# Patient Record
Sex: Female | Born: 1980 | Race: White | Hispanic: No | Smoking: Current every day smoker
Health system: Southern US, Community
[De-identification: ages and names within clinical notes are randomized; demographics above are authoritative.]

## PROBLEM LIST (undated history)

## (undated) DIAGNOSIS — K219 Gastro-esophageal reflux disease without esophagitis: Secondary | ICD-10-CM

## (undated) DIAGNOSIS — F419 Anxiety disorder, unspecified: Secondary | ICD-10-CM

## (undated) DIAGNOSIS — G43909 Migraine, unspecified, not intractable, without status migrainosus: Secondary | ICD-10-CM

## (undated) DIAGNOSIS — K589 Irritable bowel syndrome without diarrhea: Secondary | ICD-10-CM

## (undated) DIAGNOSIS — T7840XA Allergy, unspecified, initial encounter: Secondary | ICD-10-CM

## (undated) DIAGNOSIS — K9289 Other specified diseases of the digestive system: Secondary | ICD-10-CM

## (undated) DIAGNOSIS — F329 Major depressive disorder, single episode, unspecified: Secondary | ICD-10-CM

## (undated) DIAGNOSIS — F32A Depression, unspecified: Secondary | ICD-10-CM

## (undated) DIAGNOSIS — K649 Unspecified hemorrhoids: Secondary | ICD-10-CM

## (undated) DIAGNOSIS — K5900 Constipation, unspecified: Secondary | ICD-10-CM

## (undated) DIAGNOSIS — F909 Attention-deficit hyperactivity disorder, unspecified type: Secondary | ICD-10-CM

## (undated) HISTORY — DX: Constipation, unspecified: K59.00

## (undated) HISTORY — DX: Major depressive disorder, single episode, unspecified: F32.9

## (undated) HISTORY — DX: Anxiety disorder, unspecified: F41.9

## (undated) HISTORY — DX: Migraine, unspecified, not intractable, without status migrainosus: G43.909

## (undated) HISTORY — DX: Gastro-esophageal reflux disease without esophagitis: K21.9

## (undated) HISTORY — DX: Other specified diseases of the digestive system: K92.89

## (undated) HISTORY — PX: TUBAL LIGATION: SHX77

## (undated) HISTORY — DX: Attention-deficit hyperactivity disorder, unspecified type: F90.9

## (undated) HISTORY — DX: Depression, unspecified: F32.A

## (undated) HISTORY — DX: Unspecified hemorrhoids: K64.9

## (undated) HISTORY — DX: Allergy, unspecified, initial encounter: T78.40XA

## (undated) HISTORY — DX: Irritable bowel syndrome, unspecified: K58.9

---
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
## 2001-02-13 ENCOUNTER — Other Ambulatory Visit: Admission: RE | Admit: 2001-02-13 | Discharge: 2001-02-13 | Payer: Self-pay | Admitting: Family Medicine

## 2001-09-09 ENCOUNTER — Emergency Department (HOSPITAL_COMMUNITY): Admission: EM | Admit: 2001-09-09 | Discharge: 2001-09-10 | Payer: Self-pay | Admitting: Emergency Medicine

## 2002-03-12 ENCOUNTER — Other Ambulatory Visit: Admission: RE | Admit: 2002-03-12 | Discharge: 2002-03-12 | Payer: Self-pay | Admitting: Family Medicine

## 2003-04-12 ENCOUNTER — Other Ambulatory Visit: Admission: RE | Admit: 2003-04-12 | Discharge: 2003-04-12 | Payer: Self-pay | Admitting: Family Medicine

## 2004-02-15 ENCOUNTER — Ambulatory Visit: Payer: Self-pay | Admitting: Family Medicine

## 2004-06-29 ENCOUNTER — Ambulatory Visit: Payer: Self-pay | Admitting: Family Medicine

## 2005-04-16 ENCOUNTER — Ambulatory Visit: Payer: Self-pay | Admitting: Family Medicine

## 2005-05-30 ENCOUNTER — Other Ambulatory Visit: Admission: RE | Admit: 2005-05-30 | Discharge: 2005-05-30 | Payer: Self-pay | Admitting: Obstetrics and Gynecology

## 2005-07-11 ENCOUNTER — Ambulatory Visit: Payer: Self-pay | Admitting: Family Medicine

## 2005-12-07 ENCOUNTER — Inpatient Hospital Stay (HOSPITAL_COMMUNITY): Admission: AD | Admit: 2005-12-07 | Discharge: 2005-12-09 | Payer: Self-pay | Admitting: Obstetrics and Gynecology

## 2008-03-21 ENCOUNTER — Ambulatory Visit: Payer: Self-pay | Admitting: Family Medicine

## 2008-03-21 DIAGNOSIS — J069 Acute upper respiratory infection, unspecified: Secondary | ICD-10-CM | POA: Insufficient documentation

## 2009-02-18 HISTORY — PX: ESSURE TUBAL LIGATION: SUR464

## 2009-05-16 ENCOUNTER — Inpatient Hospital Stay (HOSPITAL_COMMUNITY): Admission: AD | Admit: 2009-05-16 | Discharge: 2009-05-16 | Payer: Self-pay | Admitting: Obstetrics & Gynecology

## 2009-08-30 ENCOUNTER — Inpatient Hospital Stay (HOSPITAL_COMMUNITY): Admission: RE | Admit: 2009-08-30 | Discharge: 2009-09-01 | Payer: Self-pay | Admitting: Obstetrics and Gynecology

## 2009-09-04 ENCOUNTER — Ambulatory Visit: Admission: RE | Admit: 2009-09-04 | Discharge: 2009-09-04 | Payer: Self-pay | Admitting: Obstetrics and Gynecology

## 2010-02-14 ENCOUNTER — Ambulatory Visit (HOSPITAL_COMMUNITY)
Admission: RE | Admit: 2010-02-14 | Discharge: 2010-02-14 | Payer: Self-pay | Source: Home / Self Care | Attending: Obstetrics and Gynecology | Admitting: Obstetrics and Gynecology

## 2010-04-11 ENCOUNTER — Ambulatory Visit (INDEPENDENT_AMBULATORY_CARE_PROVIDER_SITE_OTHER): Payer: BC Managed Care – PPO | Admitting: Family Medicine

## 2010-04-11 ENCOUNTER — Encounter: Payer: Self-pay | Admitting: Family Medicine

## 2010-04-11 DIAGNOSIS — J069 Acute upper respiratory infection, unspecified: Secondary | ICD-10-CM

## 2010-04-17 NOTE — Assessment & Plan Note (Signed)
Summary: sinus infection/alc   Vital Signs:  Patient profile:   30 year old female Weight:      142.75 pounds Temp:     98.4 degrees F oral Pulse rate:   67 / minute Pulse rhythm:   regular BP sitting:   110 / 72  (left arm) Cuff size:   regular  Vitals Entered By: Linde Gillis CMA Donika Butner Dull) (April 11, 2010 1:56 PM) CC: congestion    History of Present Illness: Chest congestion with cough for 10 days.  Head congestions started yesterday.  Mult sick contacts at home and work.  Nursing.  Taking prenatal vitamin.  NKDA.  Some chills last night.  No known fevers.  No diarrhea.  No vomiting.  Cough is improved.  The head congestion is the main issue for the patient.    Allergies (verified): No Known Drug Allergies  Review of Systems       See HPI.  Otherwise negative.    Physical Exam  General:  GEN: nad, alert and oriented HEENT: mucous membranes moist, TM w/o erythema, nasal epithelium injected, OP with cobblestoning but no exudates NECK: supple w/o LA CV: rrr. PULM: ctab, no inc wob ABD: soft, +bs EXT: no edema  max and frontal sinuses not tender to palpation    Impression & Recommendations:  Problem # 1:  URI (ICD-465.9) likely viral.  supporitve tx, nasal saline, rest and fluids.  follow up as needed.  I wouldn't start antibiotics now as this is likely going to resolve on its own.    Complete Medication List: 1)  Prenatal Vitamins 0.8 Mg Tabs (Prenatal multivit-min-fe-fa) .... Take one tablet by mouth daily  Patient Instructions: 1)  Get plenty of rest, drink lots of clear liquids, and use Tylenol for fever and comfort.  Call me next week if you're not better, sooner if you'er feeling worse.  This should gradually get better.  Call me if you have questions in the meantime.  Take care.    Orders Added: 1)  Est. Patient Level III [14782]    Current Allergies (reviewed today): No known allergies

## 2010-05-06 LAB — CBC
HCT: 32.8 % — ABNORMAL LOW (ref 36.0–46.0)
HCT: 35.9 % — ABNORMAL LOW (ref 36.0–46.0)
Hemoglobin: 12.3 g/dL (ref 12.0–15.0)
MCHC: 34.1 g/dL (ref 30.0–36.0)
MCHC: 34.2 g/dL (ref 30.0–36.0)
Platelets: 156 10*3/uL (ref 150–400)
RDW: 13.3 % (ref 11.5–15.5)
RDW: 13.5 % (ref 11.5–15.5)
WBC: 10.8 10*3/uL — ABNORMAL HIGH (ref 4.0–10.5)
WBC: 15 10*3/uL — ABNORMAL HIGH (ref 4.0–10.5)

## 2010-05-13 LAB — URINALYSIS, ROUTINE W REFLEX MICROSCOPIC
Hgb urine dipstick: NEGATIVE
Nitrite: NEGATIVE
Protein, ur: NEGATIVE mg/dL
pH: 6.5 (ref 5.0–8.0)

## 2010-10-29 ENCOUNTER — Encounter: Payer: Self-pay | Admitting: Family Medicine

## 2010-10-30 ENCOUNTER — Encounter: Payer: Self-pay | Admitting: Family Medicine

## 2010-10-30 ENCOUNTER — Ambulatory Visit (INDEPENDENT_AMBULATORY_CARE_PROVIDER_SITE_OTHER): Payer: Self-pay | Admitting: Family Medicine

## 2010-10-30 DIAGNOSIS — H9202 Otalgia, left ear: Secondary | ICD-10-CM

## 2010-10-30 DIAGNOSIS — H9209 Otalgia, unspecified ear: Secondary | ICD-10-CM

## 2010-10-30 NOTE — Assessment & Plan Note (Signed)
L serous otitis. Treat with nasal saline, nasal steroid (veramyst sample provided). Take ibuprofen for discomfort. If not improving or any worsening, low threshold to start abx, advised to call us if this is case. Pt agrees with plan.

## 2010-10-30 NOTE — Patient Instructions (Signed)
Looks like congestion behind L ear drum, no infection. Treat with nasal saline over the counter several times a day and veramyst sample 2 sprays per nostril daily. Take ibuprofen 400mg  2-3 times daily for discomfort. If fever >101, or worsening or not improving as expected, let us know.

## 2010-10-30 NOTE — Progress Notes (Signed)
  Subjective:    Patient ID: Connie Roach, female    DOB: December 18, 1980, 30 y.o.   MRN: [id number]  HPI CC: L ear pain  Pressure and pain left ear.  Started in right ear, then progressed to left ear yesterday.  Last night mild temperature.  No draining from ear.  No recent swimming.  No ringing in ears.  No hearing changes.  No RN, congestion, sneezing.  Mild ST.  No h/o ear problems in past.  Daughter on abx for sinus infection and ear infection.  Pt smokes some.    Review of Systems Per HPI    Objective:   Physical Exam  Nursing note and vitals reviewed. Constitutional: She appears well-developed and well-nourished. No distress.  HENT:  Head: Normocephalic and atraumatic.  Right Ear: Hearing, tympanic membrane, external ear and ear canal normal.  Left Ear: Hearing, external ear and ear canal normal.  Nose: Nose normal. No mucosal edema or rhinorrhea. Right sinus exhibits no maxillary sinus tenderness and no frontal sinus tenderness. Left sinus exhibits no maxillary sinus tenderness and no frontal sinus tenderness.  Mouth/Throat: Uvula is midline, oropharynx is clear and moist and mucous membranes are normal. No oropharyngeal exudate, posterior oropharyngeal edema, posterior oropharyngeal erythema or tonsillar abscesses.       L TM ear with fluid behind TM, no erythema, good light reflex.  Eyes: Conjunctivae and EOM are normal. Pupils are equal, round, and reactive to light. No scleral icterus.  Neck: Normal range of motion. Neck supple.  Lymphadenopathy:    She has no cervical adenopathy.  Skin: Skin is warm and dry. No rash noted.          Assessment & Plan:

## 2012-12-21 ENCOUNTER — Other Ambulatory Visit: Payer: Self-pay | Admitting: Obstetrics and Gynecology

## 2013-02-06 ENCOUNTER — Ambulatory Visit (INDEPENDENT_AMBULATORY_CARE_PROVIDER_SITE_OTHER): Payer: BC Managed Care – PPO | Admitting: Emergency Medicine

## 2013-02-06 VITALS — BP 98/60 | HR 83 | Temp 99.3°F | Resp 18 | Ht 62.5 in | Wt 108.0 lb

## 2013-02-06 DIAGNOSIS — J029 Acute pharyngitis, unspecified: Secondary | ICD-10-CM

## 2013-02-06 LAB — POCT RAPID STREP A (OFFICE): Rapid Strep A Screen: NEGATIVE

## 2013-02-06 NOTE — Progress Notes (Addendum)
Subjective:  This chart was scribed for Lesle Chris, MD by Quintella Reichert, ED scribe.  This patient was seen in room [facility] Room 4 and the patient's care was started at 3:43 PM.   Patient ID: Connie Roach, female    DOB: 1980-03-11, 32 y.o.   MRN: [id number]  Chief Complaint  Patient presents with  . Sore Throat    x1 mth   . Cough  . Otalgia    left ear pain x weeks     HPI  HPI Comments: Connie Roach is a 32 y.o. female who presents complaining of 2 weeks of worsening left ear pain.  Pt states that one month ago she initially developed a raspy cough with associated sore throat.  2 weeks ago she also developed intermittent aching pain in her left ear.  She states her coughing has improved and is now mostly limited to the morning and her sore throat is mostly limited to night.  However last night her ear pain worsened and it has been persistent since then.  She denies hearing loss.  She denies fevers.  She denies h/o seasonal allergies.  She notes that her mother has TMJ.  Pt's daughter has been sick recently with an ear infection and had issues with an allergic reaction to the amoxicillin she was placed on.   Patient Active Problem List   Diagnosis Date Noted  . Left ear pain 10/30/2010    Past Medical History  Diagnosis Date  . Depression     Past Surgical History  Procedure Laterality Date  . Essure tubal ligation  2011  . Tubal ligation      Prior to Admission medications   Medication Sig Start Date End Date Taking? Authorizing Provider  sertraline (ZOLOFT) 50 MG tablet Take 50 mg by mouth daily.   Yes Historical Provider, MD        Review of Systems  Constitutional: Negative for fever.  HENT: Positive for ear pain and sore throat. Negative for hearing loss.   Respiratory: Positive for cough.   Allergic/Immunologic: Negative for environmental allergies.       Objective:   Physical Exam CONSTITUTIONAL: Well developed/well nourished HEAD:  Normocephalic/atraumatic neither of the TMs move with pneumatic otoscopy. Her hearing is normal bilaterally weber does not localize Rinne is normal. EYES: EOMI/PERRL ENMT: Mucous membranes moist there is significant tenderness over the left TMJ NECK: supple no meningeal signs SPINE:entire spine nontender CV: S1/S2 noted, no murmurs/rubs/gallops noted LUNGS: Lungs are clear to auscultation bilaterally, no apparent distress ABDOMEN: soft, nontender, no rebound or guarding GU:no cva tenderness NEURO: Pt is awake/alert, moves all extremitiesx4 EXTREMITIES: pulses normal, full ROM SKIN: warm, color normal PSYCH: no abnormalities of mood noted   BP 98/60  Pulse 83  Temp(Src) 99.3 F (37.4 C) (Oral)  Resp 18  Ht 5' 2.5" (1.588 m)  Wt 108 lb (48.988 kg)  BMI 19.43 kg/m2  SpO2 99%  LMP 01/14/2013  Results for orders placed in visit on 02/06/13  POCT RAPID STREP A (OFFICE)      Result Value Range   Rapid Strep A Screen Negative  Negative        Assessment & Plan:  Patient advised to take Aleve 2 twice a day with food she was given information about TMJ and advised to follow up with her dentist    I personally performed the services described in this documentation, which was scribed in my presence. The recorded information has been reviewed and is accurate.

## 2013-02-06 NOTE — Patient Instructions (Signed)
Temporomandibular Problems  Temporomandibular joint (TMJ) dysfunction means there are problems with the joint between your jaw and your skull. This is a joint lined by cartilage like other joints in your body but also has a Hughlett disc in the joint which keeps the bones from rubbing on each other. These joints are like other joints and can get inflamed (sore) from arthritis and other problems. When this joint gets sore, it can cause headaches and pain in the jaw and the face. CAUSES  Usually the arthritic types of problems are caused by soreness in the joint. Soreness in the joint can also be caused by overuse. This may come from grinding your teeth. It may also come from mis-alignment in the joint. DIAGNOSIS Diagnosis of this condition can often be made by history and exam. Sometimes your caregiver may need X-rays or an MRI scan to determine the exact cause. It may be necessary to see your dentist to determine if your teeth and jaws are lined up correctly. TREATMENT  Most of the time this problem is not serious; however, sometimes it can persist (become chronic). When this happens medications that will cut down on inflammation (soreness) help. Sometimes a shot of cortisone into the joint will be helpful. If your teeth are not aligned it may help for your dentist to make a splint for your mouth that can help this problem. If no physical problems can be found, the problem may come from tension. If tension is found to be the cause, biofeedback or relaxation techniques may be helpful. HOME CARE INSTRUCTIONS   Later in the day, applications of ice packs may be helpful. Ice can be used in a plastic bag with a towel around it to prevent frostbite to skin. This may be used about every 2 hours for 20 to 30 minutes, as needed while awake, or as directed by your caregiver.  Only take over-the-counter or prescription medicines for pain, discomfort, or fever as directed by your caregiver.  If physical therapy was  prescribed, follow your caregiver's directions.  Wear mouth appliances as directed if they were given. Document Released: 10/30/2000 Document Revised: 04/29/2011 Document Reviewed: 02/07/2008 [employer] Patient Information 2014 [employer].  

## 2013-11-10 ENCOUNTER — Ambulatory Visit (INDEPENDENT_AMBULATORY_CARE_PROVIDER_SITE_OTHER): Payer: BC Managed Care – PPO | Admitting: Family Medicine

## 2013-11-10 ENCOUNTER — Telehealth: Payer: Self-pay | Admitting: Family Medicine

## 2013-11-10 ENCOUNTER — Encounter: Payer: Self-pay | Admitting: Family Medicine

## 2013-11-10 VITALS — BP 122/62 | HR 68 | Temp 98.2°F | Ht 62.5 in | Wt 113.5 lb

## 2013-11-10 DIAGNOSIS — J019 Acute sinusitis, unspecified: Secondary | ICD-10-CM

## 2013-11-10 DIAGNOSIS — B9689 Other specified bacterial agents as the cause of diseases classified elsewhere: Secondary | ICD-10-CM | POA: Insufficient documentation

## 2013-11-10 MED ORDER — AMOXICILLIN-POT CLAVULANATE 875-125 MG PO TABS: 1.0000 | ORAL_TABLET | Freq: Two times a day (BID) | ORAL | Status: DC

## 2013-11-10 NOTE — Progress Notes (Signed)
   Subjective:    Patient ID: Connie Roach, female    DOB: 02-09-1981, 33 y.o.   MRN: [id number]  HPI Here with uri symptoms and sinus pain   Started on Sept 8th  She has cough and bad head congestion  Cough is prod- typically yellow to clear   Sinuses - a lot of pressure - around both eyes  Ears are popping/ full and uncomfortable   Throat no longer hurts   No fever at home   She tried sudafed and mucinex otc for symptoms  No chance she is pregnant   Patient Active Problem List   Diagnosis Date Noted  . Left ear pain 10/30/2010   Past Medical History  Diagnosis Date  . Depression    Past Surgical History  Procedure Laterality Date  . Essure tubal ligation  2011   History  Substance Use Topics  . Smoking status: Current Some Day Smoker  . Smokeless tobacco: Not on file  . Alcohol Use: Yes     Comment: Occasional   No family history on file. No Known Allergies Current Outpatient Prescriptions on File Prior to Visit  Medication Sig Dispense Refill  . sertraline (ZOLOFT) 50 MG tablet Take 50 mg by mouth daily.       No current facility-administered medications on file prior to visit.      Review of Systems Review of Systems  Constitutional: Negative for fever, appetite change, and unexpected weight change. pos for fatigue and malaise  ENT pos for cong/rhinorrhea/ sinus pain  Eyes: Negative for pain and visual disturbance.  Respiratory: Negative for wheeze and shortness of breath.   Cardiovascular: Negative for cp or palpitations    Gastrointestinal: Negative for nausea, diarrhea and constipation.  Genitourinary: Negative for urgency and frequency.  Skin: Negative for pallor or rash   Neurological: Negative for weakness, light-headedness, numbness and headaches.  Hematological: Negative for adenopathy. Does not bruise/bleed easily.  Psychiatric/Behavioral: Negative for dysphoric mood. The patient is not nervous/anxious.         Objective:   Physical Exam    Constitutional: She appears well-developed and well-nourished. No distress.  HENT:  Head: Normocephalic and atraumatic.  Right Ear: External ear normal.  Left Ear: External ear normal.  Mouth/Throat: Oropharynx is clear and moist. No oropharyngeal exudate.  Nares are injected and congested   bilat maxillary and frontal sinus tenderness  Eyes: Conjunctivae and EOM are normal. Pupils are equal, round, and reactive to light.  Neck: Normal range of motion. Neck supple.  Cardiovascular: Normal rate, regular rhythm and normal heart sounds.   Pulmonary/Chest: Effort normal and breath sounds normal. No respiratory distress. She has no wheezes. She has no rales.  Lymphadenopathy:    She has no cervical adenopathy.  Neurological: She is alert. No cranial nerve deficit.  Skin: Skin is warm and dry. No rash noted.  Psychiatric: She has a normal mood and affect.          Assessment & Plan:   Problem List Items Addressed This Visit     Respiratory   Acute bacterial sinusitis - Primary     Cover with augmentin  Disc symptomatic care - see instructions on AVS  Update if not starting to improve in a week or if worsening      Relevant Medications      amoxicillin-clavulanate (AUGMENTIN) tablet 875-125 mg

## 2013-11-10 NOTE — Progress Notes (Signed)
Pre visit review using our clinic review tool, if applicable. No additional management support is needed unless otherwise documented below in the visit note. 

## 2013-11-10 NOTE — Telephone Encounter (Signed)
Patient Information:  Caller Name: Nyeli  Phone: [phone]  Patient: Connie Roach, Connie Roach  Gender: Female  DOB: [birth year]  Age: 33 Years  PCP: Roxy Manns [facility])  Pregnant: No  Office Follow Up:  Does the office need to follow up with this patient?: No  Instructions For The Office: N/A  RN Note:  Patient states she developed cough, nasal and chest congestion. Onset 10/26/13. Patient states she has tried Sudafed, Saline nasal spray, Mucinex, Zyrtec, Benadryl and Eucalyptis without improvement. Afebrile. States she expectorated thick, yellow sputum. Denies wheezing. Patient is taking fluids well. Denies sore throat at present. Patient complains of sinus pain/pressure. Care advice given per guidelines. Patient advised Neti Pot, Mucinex, inhaled steam, humidifier, warm fluids with honey. Call back parameters reviewed. Patient verbalizes understanding.  Symptoms  Reason For Call & Symptoms: Cough, Congestion  Reviewed Health History In EMR: Yes  Reviewed Medications In EMR: Yes  Reviewed Allergies In EMR: Yes  Reviewed Surgeries / Procedures: Yes  Date of Onset of Symptoms: 10/26/2013  Treatments Tried: Sudafed, Mucinex, Saline nasal Spray, Zyrtec, Benadryl, Eucalyptis  Treatments Tried Worked: No OB / GYN:  LMP: 11/08/2013  Guideline(s) Used:  Colds  Disposition Per Guideline:   See Today or Tomorrow in Office  Reason For Disposition Reached:   Sinus congestion (pressure, fullness) present > 10 days  Advice Given:  For a Stuffy Nose - Use Nasal Washes:  Introduction: Saline (salt water) nasal irrigation (nasal wash) is an effective and simple home remedy for treating stuffy nose and sinus congestion. The nose can be irrigated by pouring, spraying, or squirting salt water into the nose and then letting it run back out.  How it Helps: The salt water rinses out excess mucus, washes out any irritants (dust, allergens) that might be present, and moistens the nasal cavity.  Methods: There are several ways to perform nasal irrigation. You can use a saline nasal spray bottle (available over-the-counter), a rubber ear syringe, a medical syringe without the needle, or a Neti Pot.  Humidifier:  If the air in your home is dry, use a cool-mist humidifier  Call Back If:  Difficulty breathing occurs  Fever lasts more than 3 days  You become worse  Cough Medicines:  Home Remedy - Honey: This old home remedy has been shown to help decrease coughing at night. The adult dosage is 2 teaspoons (10 ml) at bedtime. Honey should not be given to infants under one year of age.  Patient Will Follow Care Advice:  YES  Appointment Scheduled:  11/10/2013 12:30:00 Appointment Scheduled Provider:  Roxy Manns [facility])

## 2013-11-10 NOTE — Telephone Encounter (Signed)
I will see her then  

## 2013-11-10 NOTE — Patient Instructions (Signed)
I think you have a bacterial sinus infection  Drink fluids/try to rest  Use warm compresses on your sinuses and breathe steam Try nasal saline spray also for congestion  mucinex and sudafed are ok if helpful  Take augmentin as directed  Update if not starting to improve in a week or if worsening

## 2013-11-11 NOTE — Assessment & Plan Note (Signed)
Cover with augmentin  Disc symptomatic care - see instructions on AVS  Update if not starting to improve in a week or if worsening   

## 2013-12-22 ENCOUNTER — Other Ambulatory Visit: Payer: Self-pay | Admitting: Obstetrics and Gynecology

## 2013-12-23 LAB — CYTOLOGY - PAP

## 2016-02-19 HISTORY — PX: OTHER SURGICAL HISTORY: SHX169

## 2017-08-12 ENCOUNTER — Other Ambulatory Visit: Payer: Self-pay | Admitting: Obstetrics and Gynecology

## 2017-08-12 DIAGNOSIS — R928 Other abnormal and inconclusive findings on diagnostic imaging of breast: Secondary | ICD-10-CM

## 2017-08-13 ENCOUNTER — Ambulatory Visit
Admission: RE | Admit: 2017-08-13 | Discharge: 2017-08-13 | Disposition: A | Discharge status: Home / Self Care | Payer: BLUE CROSS/BLUE SHIELD | Source: Ambulatory Visit | Attending: Obstetrics and Gynecology | Admitting: Obstetrics and Gynecology

## 2017-08-13 ENCOUNTER — Other Ambulatory Visit: Payer: Self-pay | Admitting: Obstetrics and Gynecology

## 2017-08-13 DIAGNOSIS — R928 Other abnormal and inconclusive findings on diagnostic imaging of breast: Secondary | ICD-10-CM

## 2017-08-19 ENCOUNTER — Other Ambulatory Visit: Payer: Self-pay | Admitting: Obstetrics and Gynecology

## 2017-08-19 DIAGNOSIS — N63 Unspecified lump in unspecified breast: Secondary | ICD-10-CM

## 2017-11-03 DIAGNOSIS — F32A Depression, unspecified: Secondary | ICD-10-CM | POA: Insufficient documentation

## 2017-12-24 ENCOUNTER — Ambulatory Visit (HOSPITAL_COMMUNITY)
Admission: EM | Admit: 2017-12-24 | Discharge: 2017-12-24 | Disposition: A | Discharge status: Home / Self Care | Payer: BLUE CROSS/BLUE SHIELD | Attending: Family Medicine | Admitting: Family Medicine

## 2017-12-24 ENCOUNTER — Encounter (HOSPITAL_COMMUNITY): Payer: Self-pay | Admitting: Emergency Medicine

## 2017-12-24 DIAGNOSIS — G43009 Migraine without aura, not intractable, without status migrainosus: Secondary | ICD-10-CM | POA: Diagnosis not present

## 2017-12-24 DIAGNOSIS — R42 Dizziness and giddiness: Secondary | ICD-10-CM

## 2017-12-24 MED ORDER — DEXAMETHASONE SODIUM PHOSPHATE 10 MG/ML IJ SOLN
INTRAMUSCULAR | Status: AC
  Filled 2017-12-24: qty 1

## 2017-12-24 MED ORDER — KETOROLAC TROMETHAMINE 60 MG/2ML IM SOLN
INTRAMUSCULAR | Status: AC
  Filled 2017-12-24: qty 2

## 2017-12-24 MED ORDER — DEXAMETHASONE SODIUM PHOSPHATE 10 MG/ML IJ SOLN
10.0000 mg | Freq: Once | INTRAMUSCULAR | Status: AC
  Administered 2017-12-24: 10 mg via INTRAMUSCULAR

## 2017-12-24 MED ORDER — KETOROLAC TROMETHAMINE 60 MG/2ML IM SOLN
60.0000 mg | Freq: Once | INTRAMUSCULAR | Status: AC
  Administered 2017-12-24: 60 mg via INTRAMUSCULAR

## 2017-12-24 MED ORDER — METOCLOPRAMIDE HCL 5 MG/ML IJ SOLN
INTRAMUSCULAR | Status: AC
  Filled 2017-12-24: qty 2

## 2017-12-24 MED ORDER — METOCLOPRAMIDE HCL 5 MG/ML IJ SOLN
5.0000 mg | Freq: Once | INTRAMUSCULAR | Status: AC
  Administered 2017-12-24: 5 mg via INTRAMUSCULAR

## 2017-12-24 NOTE — Discharge Instructions (Signed)
Toradol, decadron and reglan given in office.   Rest and drink plenty of fluids Recommend follow up with PCP for further evaluation and management of reoccurring migraines If symptoms do not improve with intervention or worsen go to the ED.  Go to the ED if you experience new symptoms such as fever, nausea, vomiting, abdominal pain, extremity weakness, slurred speech, facial droop, etc..Marland Kitchen

## 2017-12-24 NOTE — ED Provider Notes (Signed)
[facility]   [id number] 12/24/17 Arrival Time: 1125  [id number] and dizziness  SUBJECTIVE:  Connie Roach is a 37 y.o. female who complains of HA that started last night.  Denies a precipitating event, or recent head trauma.  Patient localizes her pain to the frontal aspect head.  Patient has tried oral sumatriptan without relief. Symptoms are made worse with light and noise.  Reports similar symptoms in the past.  Complains of associated photophobia, and phonophobia.    Patient also mentions intermittent dizziness that began after going on a vacation to [place].  Describes as she is moving while standing still.  Worse with sudden movements and position change.  Denies previous symptoms.  Reports "popping ears a lot."  Patient denies fever, chills, ear pain, nasal congestion, rhinorrhea, sore throat, nausea, vomiting, aura, rhinorrhea, watery eyes, chest pain, cough, SOB, abdominal pain, weakness, numbness or tingling.    ROS: As per HPI.  Past Medical History:  Diagnosis Date  . Depression    Past Surgical History:  Procedure Laterality Date  . ESSURE TUBAL LIGATION  2011   No Known Allergies No current facility-administered medications on file prior to encounter.    Current Outpatient Medications on File Prior to Encounter  Medication Sig Dispense Refill  . sertraline (ZOLOFT) 50 MG tablet Take 50 mg by mouth daily.     Social History   Socioeconomic History  . Marital status: Married    Spouse name: Not on file  . Number of children: Not on file  . Years of education: Not on file  . Highest education level: Not on file  Occupational History  . Not on file  Social Needs  . Financial resource strain: Not on file  . Food insecurity:    Worry: Not on file    Inability: Not on file  . Transportation needs:    Medical: Not on file    Non-medical: Not on file  Tobacco Use  . Smoking status: Current Some Day Smoker  Substance and Sexual Activity  . Alcohol  use: Yes    Comment: Occasional  . Drug use: No  . Sexual activity: Not on file  Lifestyle  . Physical activity:    Days per week: Not on file    Minutes per session: Not on file  . Stress: Not on file  Relationships  . Social connections:    Talks on phone: Not on file    Gets together: Not on file    Attends religious service: Not on file    Active member of club or organization: Not on file    Attends meetings of clubs or organizations: Not on file    Relationship status: Not on file  . Intimate partner violence:    Fear of current or ex partner: Not on file    Emotionally abused: Not on file    Physically abused: Not on file    Forced sexual activity: Not on file  Other Topics Concern  . Not on file  Social History Narrative  . Not on file   Family History  Problem Relation Age of Onset  . Breast cancer Paternal Grandmother 67  . Breast cancer Paternal Aunt 50    OBJECTIVE:  Vitals:   12/24/17 1155  BP: 113/73  Pulse: 69  Resp: 18  Temp: 98.5 F (36.9 C)  TempSrc: Oral  SpO2: 100%    General appearance: alert; no distress; laying on exam table in darkened room Eyes: PERRLA; EOMI HENT:  normocephalic; atraumatic Neck: supple with FROM Lungs: clear to auscultation bilaterally Heart: regular rate and rhythm.  Radial pulses 2+ symmetrical bilaterally Extremities: no edema; symmetrical with no gross deformities Skin: warm and dry Neurologic: CN 2-12 grossly intact; finger to nose without difficulty; normal gait; negative pronator drift; strength and sensation intact bilaterally about the upper and lower extremities Psychological: alert and cooperative; normal mood and affect  ASSESSMENT & PLAN:  1. Migraine without aura and without status migrainosus, not intractable   2. Vertigo     Meds ordered this encounter  Medications  . ketorolac (TORADOL) injection 60 mg  . metoCLOPramide (REGLAN) injection 5 mg  . dexamethasone (DECADRON) injection 10 mg    Toradol, decadron and reglan given in office.   Rest and drink plenty of fluids Recommend follow up with PCP for further evaluation and management of reoccurring migraines If symptoms do not improve with intervention or worsen go to the ED.  Go to the ED if you experience new symptoms such as fever, nausea, vomiting, abdominal pain, extremity weakness, slurred speech, facial droop, etc...  Reviewed expectations re: course of current medical issues. Questions answered. Outlined signs and symptoms indicating need for more acute intervention. Patient verbalized understanding. After Visit Summary given.   Rennis Harding, PA-C 12/24/17 1307

## 2017-12-24 NOTE — ED Triage Notes (Signed)
Pt sts frontal HA with some dizziness starting last night; pt sts hx of same

## 2018-02-13 ENCOUNTER — Other Ambulatory Visit: Payer: BLUE CROSS/BLUE SHIELD

## 2018-02-16 ENCOUNTER — Ambulatory Visit
Admission: RE | Admit: 2018-02-16 | Discharge: 2018-02-16 | Disposition: A | Discharge status: Home / Self Care | Payer: BLUE CROSS/BLUE SHIELD | Source: Ambulatory Visit | Attending: Obstetrics and Gynecology | Admitting: Obstetrics and Gynecology

## 2018-02-16 ENCOUNTER — Other Ambulatory Visit: Payer: Self-pay | Admitting: Obstetrics and Gynecology

## 2018-02-16 DIAGNOSIS — N6320 Unspecified lump in the left breast, unspecified quadrant: Secondary | ICD-10-CM

## 2018-02-16 DIAGNOSIS — N63 Unspecified lump in unspecified breast: Secondary | ICD-10-CM

## 2018-04-01 ENCOUNTER — Encounter: Payer: Self-pay | Admitting: Emergency Medicine

## 2018-04-01 ENCOUNTER — Ambulatory Visit
Admission: EM | Admit: 2018-04-01 | Discharge: 2018-04-01 | Disposition: A | Discharge status: Home / Self Care | Payer: BLUE CROSS/BLUE SHIELD | Attending: Family Medicine | Admitting: Family Medicine

## 2018-04-01 DIAGNOSIS — F17200 Nicotine dependence, unspecified, uncomplicated: Secondary | ICD-10-CM

## 2018-04-01 DIAGNOSIS — J0100 Acute maxillary sinusitis, unspecified: Secondary | ICD-10-CM

## 2018-04-01 MED ORDER — AMOXICILLIN-POT CLAVULANATE 875-125 MG PO TABS: 1.0000 | ORAL_TABLET | Freq: Two times a day (BID) | ORAL | 0 refills | Status: DC

## 2018-04-01 NOTE — ED Notes (Signed)
Patient able to ambulate independently  

## 2018-04-01 NOTE — ED Triage Notes (Signed)
Pt presents to [facility] for assessment of nasal congestion, headache, ear popping, post-nasal drip and cough x 1.5 weeks.

## 2018-04-01 NOTE — ED Provider Notes (Signed)
[facility]   [id number] 04/01/18 Arrival Time: 1221  ASSESSMENT & PLAN:  1. Acute non-recurrent maxillary sinusitis     Meds ordered this encounter  Medications  . amoxicillin-clavulanate (AUGMENTIN) 875-125 MG tablet    Sig: Take 1 tablet by mouth every 12 (twelve) hours.    Dispense:  20 tablet    Refill:  0   OTC symptom care as needed. Ensure adequate fluid intake and rest.  Follow-up Information    Marcelle Overlie, MD.   Specialty:  Obstetrics and Gynecology Why:  As needed. Contact information: [address] [place] [address] [phone]           Reviewed expectations re: course of current medical issues. Questions answered. Outlined signs and symptoms indicating need for more acute intervention. Patient verbalized understanding. After Visit Summary given.   SUBJECTIVE: History from: patient.  Connie Roach is a 38 y.o. female who presents with complaint of nasal congestion, post-nasal drainage, and sinus pain/pressure. Onset gradual, over the past few days. Cold/respiratory symptoms: over the past 1.5 weeks; mild dry cough. No SOB/wheezing. Fever: no. Overall normal PO intake without n/v. OTC treatment: Mucinex/Sudafed with minimal relief. History of frequent sinus infections: no. No specific aggravating or alleviating factors reported.  Social History   Tobacco Use  Smoking Status Current Some Day Smoker  Smokeless Tobacco Never Used   ROS: As per HPI. All other systems negative.  OBJECTIVE:  Vitals:   04/01/18 1230  BP: 125/90  Pulse: 84  Resp: 18  Temp: 97.8 F (36.6 C)  TempSrc: Oral  SpO2: 99%    General appearance: alert; appears fatigued HEENT: nasal congestion; clear runny nose; throat irritation secondary to post-nasal drainage; bilateral maxillary tenderness to palpation; turbinates boggy Neck: supple without LAD; trachea midline CV: RRR Lungs: unlabored respirations, symmetrical air entry;  cough: mild; no wheezing; no respiratory distress Skin: warm and dry Psychological: alert and cooperative; normal mood and affect  No Known Allergies  Past Medical History:  Diagnosis Date  . Depression    Family History  Problem Relation Age of Onset  . Breast cancer Paternal Grandmother 42  . Breast cancer Paternal Aunt 79   Social History   Socioeconomic History  . Marital status: Married    Spouse name: Not on file  . Number of children: Not on file  . Years of education: Not on file  . Highest education level: Not on file  Occupational History  . Not on file  Social Needs  . Financial resource strain: Not on file  . Food insecurity:    Worry: Not on file    Inability: Not on file  . Transportation needs:    Medical: Not on file    Non-medical: Not on file  Tobacco Use  . Smoking status: Current Some Day Smoker  . Smokeless tobacco: Never Used  Substance and Sexual Activity  . Alcohol use: Yes    Comment: Occasional  . Drug use: No  . Sexual activity: Not on file  Lifestyle  . Physical activity:    Days per week: Not on file    Minutes per session: Not on file  . Stress: Not on file  Relationships  . Social connections:    Talks on phone: Not on file    Gets together: Not on file    Attends religious service: Not on file    Active member of club or organization: Not on file    Attends meetings of  clubs or organizations: Not on file    Relationship status: Not on file  . Intimate partner violence:    Fear of current or ex partner: Not on file    Emotionally abused: Not on file    Physically abused: Not on file    Forced sexual activity: Not on file  Other Topics Concern  . Not on file  Social History Narrative  . Not on file            Mardella Layman, MD 04/01/18 1249

## 2018-08-17 ENCOUNTER — Other Ambulatory Visit: Payer: BLUE CROSS/BLUE SHIELD

## 2018-08-19 ENCOUNTER — Ambulatory Visit
Admission: RE | Admit: 2018-08-19 | Discharge: 2018-08-19 | Disposition: A | Discharge status: Home / Self Care | Payer: BC Managed Care – PPO | Source: Ambulatory Visit | Attending: Obstetrics and Gynecology | Admitting: Obstetrics and Gynecology

## 2018-08-19 ENCOUNTER — Ambulatory Visit
Admission: RE | Admit: 2018-08-19 | Discharge: 2018-08-19 | Disposition: A | Discharge status: Home / Self Care | Payer: BLUE CROSS/BLUE SHIELD | Source: Ambulatory Visit | Attending: Obstetrics and Gynecology | Admitting: Obstetrics and Gynecology

## 2018-08-19 ENCOUNTER — Other Ambulatory Visit: Payer: Self-pay

## 2018-08-19 DIAGNOSIS — N6320 Unspecified lump in the left breast, unspecified quadrant: Secondary | ICD-10-CM

## 2018-12-09 ENCOUNTER — Encounter: Payer: Self-pay | Admitting: *Deleted

## 2018-12-09 ENCOUNTER — Encounter: Payer: Self-pay | Admitting: Neurology

## 2018-12-09 ENCOUNTER — Ambulatory Visit (INDEPENDENT_AMBULATORY_CARE_PROVIDER_SITE_OTHER): Payer: Managed Care, Other (non HMO) | Admitting: Neurology

## 2018-12-09 ENCOUNTER — Other Ambulatory Visit: Payer: Self-pay

## 2018-12-09 VITALS — BP 102/64 | HR 80 | Temp 97.9°F | Ht 62.0 in | Wt 125.0 lb

## 2018-12-09 DIAGNOSIS — G43709 Chronic migraine without aura, not intractable, without status migrainosus: Secondary | ICD-10-CM | POA: Diagnosis not present

## 2018-12-09 DIAGNOSIS — H539 Unspecified visual disturbance: Secondary | ICD-10-CM

## 2018-12-09 DIAGNOSIS — G441 Vascular headache, not elsewhere classified: Secondary | ICD-10-CM | POA: Diagnosis not present

## 2018-12-09 DIAGNOSIS — G4484 Primary exertional headache: Secondary | ICD-10-CM

## 2018-12-09 DIAGNOSIS — G4440 Drug-induced headache, not elsewhere classified, not intractable: Secondary | ICD-10-CM | POA: Diagnosis not present

## 2018-12-09 DIAGNOSIS — R51 Headache with orthostatic component, not elsewhere classified: Secondary | ICD-10-CM

## 2018-12-09 MED ORDER — TOPIRAMATE 25 MG PO TABS: 25.0000 mg | ORAL_TABLET | Freq: Every day | ORAL | 3 refills | Status: DC

## 2018-12-09 MED ORDER — METOCLOPRAMIDE HCL 10 MG PO TABS: 10.0000 mg | ORAL_TABLET | Freq: Three times a day (TID) | ORAL | 11 refills | Status: DC | PRN

## 2018-12-09 NOTE — Patient Instructions (Signed)
MRI brain w/wo contrast Start Topamax for prevention Reduce use of ibuprofen max 2 days a week Reglan(Metaclopramide) acutely as needed  Metoclopramide tablets What is this medicine? METOCLOPRAMIDE (met oh kloe PRA mide) is used to treat the symptoms of gastroesophageal reflux disease (GERD) like heartburn. It is also used to treat people with slow emptying of the stomach and intestinal tract. This medicine may be used for other purposes; ask your health care provider or pharmacist if you have questions. COMMON BRAND NAME(S): Reglan What should I tell my health care provider before I take this medicine? They need to know if you have any of these conditions:  breast cancer  depression  diabetes  heart failure  high blood pressure  kidney disease  liver disease  Parkinson's disease or a movement disorder  pheochromocytoma  seizures  stomach obstruction, bleeding, or perforation  an unusual or allergic reaction to metoclopramide, procainamide, sulfites, other medicines, foods, dyes, or preservatives  pregnant or trying to get pregnant  breast-feeding How should I use this medicine? Take this medicine by mouth with a glass of water. Follow the directions on the prescription label. Take this medicine on an empty stomach, about 30 minutes before eating. Take your doses at regular intervals. Do not take your medicine more often than directed. Do not stop taking except on the advice of your doctor or health care professional. A special MedGuide will be given to you by the pharmacist with each prescription and refill. Be sure to read this information carefully each time. Talk to your pediatrician regarding the use of this medicine in children. Special care may be needed. Overdosage: If you think you have taken too much of this medicine contact a poison control center or emergency room at once. NOTE: This medicine is only for you. Do not share this medicine with others. What if I  miss a dose? If you miss a dose, take it as soon as you can. If it is almost time for your next dose, take only that dose. Do not take double or extra doses. What may interact with this medicine?  acetaminophen  cyclosporine  digoxin  medicines for blood pressure  medicines for diabetes, including insulin  medicines for hay fever and other allergies  medicines for depression, especially a Monoamine Oxidase Inhibitor (MAOI)  medicines for Parkinson's disease, like levodopa  medicines for sleep or for pain  quinidine  tetracycline This list may not describe all possible interactions. Give your health care provider a list of all the medicines, herbs, non-prescription drugs, or dietary supplements you use. Also tell them if you smoke, drink alcohol, or use illegal drugs. Some items may interact with your medicine. What should I watch for while using this medicine? It may take a few weeks for your stomach condition to start to get better. However, do not take this medicine for longer than 12 weeks. The longer you take this medicine, and the more you take it, the greater your chances are of developing serious side effects. If you are an elderly patient, a female patient, or you have diabetes, you may be at an increased risk for side effects from this medicine. Contact your doctor immediately if you start having movements you cannot control such as lip smacking, rapid movements of the tongue, involuntary or uncontrollable movements of the eyes, head, arms and legs, or muscle twitches and spasms. Patients and their families should watch out for worsening depression or thoughts of suicide. Also watch out for any sudden or  severe changes in feelings such as feeling anxious, agitated, panicky, irritable, hostile, aggressive, impulsive, severely restless, overly excited and hyperactive, or not being able to sleep. If this happens, especially at the beginning of treatment or after a change in dose,  call your doctor. Do not treat yourself for high fever. Ask your doctor or health care professional for advice. You may get drowsy or dizzy. Do not drive, use machinery, or do anything that needs mental alertness until you know how this drug affects you. Do not stand or sit up quickly, especially if you are an older patient. This reduces the risk of dizzy or fainting spells. Alcohol can make you more drowsy and dizzy. Avoid alcoholic drinks. What side effects may I notice from receiving this medicine? Side effects that you should report to your doctor or health care professional as soon as possible:  allergic reactions like skin rash, itching or hives, swelling of the face, lips, or tongue  abnormal production of milk in females  breast enlargement in both males and females  change in the way you walk  difficulty moving, speaking or swallowing  drooling, lip smacking, or rapid movements of the tongue  excessive sweating  fever  involuntary or uncontrollable movements of the eyes, head, arms and legs  irregular heartbeat or palpitations  muscle twitches and spasms  unusually weak or tired Side effects that usually do not require medical attention (report to your doctor or health care professional if they continue or are bothersome):  change in sex drive or performance  depressed mood  diarrhea  difficulty sleeping  headache  menstrual changes  restless or nervous This list may not describe all possible side effects. Call your doctor for medical advice about side effects. You may report side effects to FDA at 1-800-FDA-1088. Where should I keep my medicine? Keep out of the reach of children. Store at room temperature between 20 and 25 degrees C (68 and 77 degrees F). Protect from light. Keep container tightly closed. Throw away any unused medicine after the expiration date. NOTE: This sheet is a summary. It may not cover all possible information. If you have questions  about this medicine, talk to your doctor, pharmacist, or health care provider.  2020 Elsevier/Gold Standard (2015-11-22 15:13:45)  Topiramate tablets What is this medicine? TOPIRAMATE (toe PYRE a mate) is used to treat seizures in adults or children with epilepsy. It is also used for the prevention of migraine headaches. This medicine may be used for other purposes; ask your health care provider or pharmacist if you have questions. COMMON BRAND NAME(S): Topamax, Topiragen What should I tell my health care provider before I take this medicine? They need to know if you have any of these conditions:  bleeding disorders  cirrhosis of the liver or liver disease  diarrhea  glaucoma  kidney stones or kidney disease  low blood counts, like low white cell, platelet, or red cell counts  lung disease like asthma, obstructive pulmonary disease, emphysema  metabolic acidosis  on a ketogenic diet  schedule for surgery or a procedure  suicidal thoughts, plans, or attempt; a previous suicide attempt by you or a family member  an unusual or allergic reaction to topiramate, other medicines, foods, dyes, or preservatives  pregnant or trying to get pregnant  breast-feeding How should I use this medicine? Take this medicine by mouth with a glass of water. Follow the directions on the prescription label. Do not crush or chew. You may take this medicine with  meals. Take your medicine at regular intervals. Do not take it more often than directed. Talk to your pediatrician regarding the use of this medicine in children. Special care may be needed. While this drug may be prescribed for children as young as 12 years of age for selected conditions, precautions do apply. Overdosage: If you think you have taken too much of this medicine contact a poison control center or emergency room at once. NOTE: This medicine is only for you. Do not share this medicine with others. What if I miss a dose? If you  miss a dose, take it as soon as you can. If your next dose is to be taken in less than 6 hours, then do not take the missed dose. Take the next dose at your regular time. Do not take double or extra doses. What may interact with this medicine? Do not take this medicine with any of the following medications:  probenecid This medicine may also interact with the following medications:  acetazolamide  alcohol  amitriptyline  aspirin and aspirin-like medicines  birth control pills  certain medicines for depression  certain medicines for seizures  certain medicines that treat or prevent blood clots like warfarin, enoxaparin, dalteparin, apixaban, dabigatran, and rivaroxaban  digoxin  hydrochlorothiazide  lithium  medicines for pain, sleep, or muscle relaxation  metformin  methazolamide  NSAIDS, medicines for pain and inflammation, like ibuprofen or naproxen  pioglitazone  risperidone This list may not describe all possible interactions. Give your health care provider a list of all the medicines, herbs, non-prescription drugs, or dietary supplements you use. Also tell them if you smoke, drink alcohol, or use illegal drugs. Some items may interact with your medicine. What should I watch for while using this medicine? Visit your doctor or health care professional for regular checks on your progress. Do not stop taking this medicine suddenly. This increases the risk of seizures if you are using this medicine to control epilepsy. Wear a medical identification bracelet or chain to say you have epilepsy or seizures, and carry a card that lists all your medicines. This medicine can decrease sweating and increase your body temperature. Watch for signs of deceased sweating or fever, especially in children. Avoid extreme heat, hot baths, and saunas. Be careful about exercising, especially in hot weather. Contact your health care provider right away if you notice a fever or decrease in  sweating. You should drink plenty of fluids while taking this medicine. If you have had kidney stones in the past, this will help to reduce your chances of forming kidney stones. If you have stomach pain, with nausea or vomiting and yellowing of your eyes or skin, call your doctor immediately. You may get drowsy, dizzy, or have blurred vision. Do not drive, use machinery, or do anything that needs mental alertness until you know how this medicine affects you. To reduce dizziness, do not sit or stand up quickly, especially if you are an older patient. Alcohol can increase drowsiness and dizziness. Avoid alcoholic drinks. If you notice blurred vision, eye pain, or other eye problems, seek medical attention at once for an eye exam. The use of this medicine may increase the chance of suicidal thoughts or actions. Pay special attention to how you are responding while on this medicine. Any worsening of mood, or thoughts of suicide or dying should be reported to your health care professional right away. This medicine may increase the chance of developing metabolic acidosis. If left untreated, this can cause kidney  stones, bone disease, or slowed growth in children. Symptoms include breathing fast, fatigue, loss of appetite, irregular heartbeat, or loss of consciousness. Call your doctor immediately if you experience any of these side effects. Also, tell your doctor about any surgery you plan on having while taking this medicine since this may increase your risk for metabolic acidosis. Birth control pills may not work properly while you are taking this medicine. Talk to your doctor about using an extra method of birth control. Women who become pregnant while using this medicine may enroll in the [place] Pregnancy Registry by calling [phone]. This registry collects information about the safety of antiepileptic drug use during pregnancy. What side effects may I notice from receiving  this medicine? Side effects that you should report to your doctor or health care professional as soon as possible:  allergic reactions like skin rash, itching or hives, swelling of the face, lips, or tongue  decreased sweating and/or rise in body temperature  depression  difficulty breathing, fast or irregular breathing patterns  difficulty speaking  difficulty walking or controlling muscle movements  hearing impairment  redness, blistering, peeling or loosening of the skin, including inside the mouth  tingling, pain or numbness in the hands or feet  unusual bleeding or bruising  unusually weak or tired  worsening of mood, thoughts or actions of suicide or dying Side effects that usually do not require medical attention (report to your doctor or health care professional if they continue or are bothersome):  altered taste  back pain, joint or muscle aches and pains  diarrhea, or constipation  headache  loss of appetite  nausea  stomach upset, indigestion  tremors This list may not describe all possible side effects. Call your doctor for medical advice about side effects. You may report side effects to FDA at 1-800-FDA-1088. Where should I keep my medicine? Keep out of the reach of children. Store at room temperature between 15 and 30 degrees C (59 and 86 degrees F) in a tightly closed container. Protect from moisture. Throw away any unused medicine after the expiration date. NOTE: This sheet is a summary. It may not cover all possible information. If you have questions about this medicine, talk to your doctor, pharmacist, or health care provider.  2020 Elsevier/Gold Standard (2013-02-08 23:17:57)

## 2018-12-09 NOTE — Progress Notes (Signed)
[facility]    Provider:  Dr Lucia Gaskins Requesting Provider: Marcelle Overlie, MD Primary Care Provider:  Marcelle Overlie, MD  CC:  Migraine  HPI:  Connie Roach is a 38 y.o. female here as requested by Marcelle Overlie, MD for migraines and tension type headache.  Past medical history depression, migraines. Mother with migraines. Patient has had them for years maybe since college or sooner. They start behind the eyes, +blurry vision,  pulsating/pounding/throbbing, photo/phonophobia, taking ibuprofen daily, occ nausea, daily headaches, no vomiting. >8 migraine days a month sleep helps, last >24 hours and can be moderately severe or severe, sound makes it worse, stress makes it worse, no head trauma, headaches can be positional and she can wake up with headaches, movement or bending over makes it worse and worsens throughout the day especially with exertion.   Reviewed notes, labs and imaging from outside physicians, which showed:   I reviewed emergency room notes.  She complained of headache that started the night prior without any precipitating event or head trauma.  She tried oral sumatriptan without relief.  Photophobia phonophobia.  Similar symptoms in the past.  Frontal predominant.  Also dizziness, feels like she is moving while standing, worse with sitting movements and position change.  Also popping of ears.  Examination was normal including neurologic examination.  Diagnosed with migraine without aura and vertigo given Reglan, Decadron and Toradol.  I not see any labs since 2011 however, in 2011 she did have anemia and elevated white blood cells, RPR was negative.  I also reviewed referring physician's notes which included no information on migraines except that patient requested an appointment with neurology.   Review of Systems: Patient complains of symptoms per HPI as well as the following symptoms headache. Pertinent negatives and positives per HPI. All others  negative.   Social History   Socioeconomic History   Marital status: Married    Spouse name: separated, not legally   Number of children: 2   Years of education: Not on file   Highest education level: Bachelor's degree (e.g., BA, AB, BS)  Occupational History   Not on file  Social Needs   Financial resource strain: Not on file   Food insecurity    Worry: Not on file    Inability: Not on file   Transportation needs    Medical: Not on file    Non-medical: Not on file  Tobacco Use   Smoking status: Current Every Day Smoker   Smokeless tobacco: Never Used   Tobacco comment: 2 packs per week  Substance and Sexual Activity   Alcohol use: Yes    Alcohol/week: 4.0 standard drinks    Types: 4 Standard drinks or equivalent per week   Drug use: Yes    Frequency: 1.0 times per week    Types: Marijuana   Sexual activity: Not on file  Lifestyle   Physical activity    Days per week: Not on file    Minutes per session: Not on file   Stress: Not on file  Relationships   Social connections    Talks on phone: Not on file    Gets together: Not on file    Attends religious service: Not on file    Active member of club or organization: Not on file    Attends meetings of clubs or organizations: Not on file    Relationship status: Not on file   Intimate partner violence    Fear of current or ex partner: Not  on file    Emotionally abused: Not on file    Physically abused: Not on file    Forced sexual activity: Not on file  Other Topics Concern   Not on file  Social History Narrative   Lives at home with her daughters   Right handed   Caffeine: 1-2 cups/day    Family History  Problem Relation Age of Onset   Headache Mother    Emphysema Maternal Grandfather    Breast cancer Paternal Grandmother 40   Leukemia Paternal Grandfather    Breast cancer Paternal Aunt 74    Past Medical History:  Diagnosis Date   Anxiety    Depression    Migraine      Patient Active Problem List   Diagnosis Date Noted   Medication overuse headache 12/10/2018   Chronic migraine without aura without status migrainosus, not intractable 12/09/2018   Acute bacterial sinusitis 11/10/2013   Left ear pain 10/30/2010    Past Surgical History:  Procedure Laterality Date   ESSURE TUBAL LIGATION  2011   uterine ablation  2018    Current Outpatient Medications  Medication Sig Dispense Refill   ALPRAZolam (XANAX) 0.25 MG tablet TAKE 1 TABLET BY MOUTH 1 TIME A DAY AS NEEDED ANXIETY/PANIC ATTACK     Doxylamine Succinate, Sleep, (SLEEP AID PO) Take by mouth.     ibuprofen (ADVIL) 200 MG tablet Take 400 mg by mouth daily as needed.     sertraline (ZOLOFT) 100 MG tablet Take 150 mg by mouth daily.     metoCLOPramide (REGLAN) 10 MG tablet Take 1 tablet (10 mg total) by mouth 3 (three) times daily as needed. For migraine or headache. 30 tablet 11   topiramate (TOPAMAX) 25 MG tablet Take 1 tablet (25 mg total) by mouth at bedtime. 90 tablet 3   No current facility-administered medications for this visit.     Allergies as of 12/09/2018   (No Known Allergies)    Vitals: BP 102/64 (BP Location: Right Arm, Patient Position: Sitting)    Pulse 80    Temp 97.9 F (36.6 C) Comment: taken at front door   Ht 5\' 2"  (1.575 m)    Wt 125 lb (56.7 kg)    BMI 22.86 kg/m  Last Weight:  Wt Readings from Last 1 Encounters:  12/09/18 125 lb (56.7 kg)   Last Height:   Ht Readings from Last 1 Encounters:  12/09/18 5\' 2"  (1.575 m)     Physical exam: Exam: Gen: NAD, conversant, well nourised, well groomed                     CV: RRR, no MRG. No Carotid Bruits. No peripheral edema, warm, nontender Eyes: Conjunctivae clear without exudates or hemorrhage  Neuro: Detailed Neurologic Exam  Speech:    Speech is normal; fluent and spontaneous with normal comprehension.  Cognition:    The patient is oriented to person, place, and time;     recent and remote  memory intact;     language fluent;     normal attention, concentration,     fund of knowledge Cranial Nerves:    The pupils are equal, round, and reactive to light. The fundi are normal and spontaneous venous pulsations are present. Visual fields are full to finger confrontation. Extraocular movements are intact. Trigeminal sensation is intact and the muscles of mastication are normal. The face is symmetric. The palate elevates in the midline. Hearing intact. Voice is normal. Shoulder shrug is  normal. The tongue has normal motion without fasciculations.   Coordination:    Normal finger to nose and heel to shin. Normal rapid alternating movements.   Gait:    Heel-toe and tandem gait are normal.   Motor Observation:    No asymmetry, no atrophy, and no involuntary movements noted. Tone:    Normal muscle tone.    Posture:    Posture is normal. normal erect    Strength:    Strength is V/V in the upper and lower limbs.      Sensation: intact to LT     Reflex Exam:  DTR's:    Deep tendon reflexes in the upper and lower extremities are normal bilaterally.   Toes:    The toes are downgoing bilaterally.   Clonus:    Clonus is absent.    Assessment/Plan:  Chronic daily headaches likely due to Migraines and medication overuse  But given concerning symptoms she needs a thorough evaluation including MRI brain. I had a long discussion with patient that her daily use of ibuprofen or Tylenol can cause medication overuse/rebound headache which is contributing to her chronic daily headaches. They only thing to do is to stop the medication unfortunately. In the timeframe after stopping at her headaches may get much worse. I will give her some medication to try to bridge that. She should significantly  improve with her chronic daily headaches after 2-4 weeks of being off her daily over-the-counter medications. Do not use these medications more than 2 times in a week.    MRI brain due to  concerning symptoms of morning headaches, positional headaches,exertional and orbital headaches  to look for space occupying mass, chiari or intracranial hypertension (pseudotumor).  [facility] TSH and was normal per patient report, will request  Migraines: Discussed migraine management including acute management and preventative management. We'll start patient on both. Discussed Topamax side effects especially teratogenicity do not get pregnant and use birth control.  Meds ordered this encounter  Medications   topiramate (TOPAMAX) 25 MG tablet    Sig: Take 1 tablet (25 mg total) by mouth at bedtime.    Dispense:  90 tablet    Refill:  3   metoCLOPramide (REGLAN) 10 MG tablet    Sig: Take 1 tablet (10 mg total) by mouth 3 (three) times daily as needed. For migraine or headache.    Dispense:  30 tablet    Refill:  11     Remember to drink plenty of fluid, eat healthy meals and do not skip any meals. Try to eat protein with a every meal and eat a healthy snack such as fruit or nuts in between meals. Try to keep a regular sleep-wake schedule and try to exercise daily, particularly in the form of walking, 20-30 minutes a day, if you can.   As far as your medications are concerned, I would like to suggest: - Stop daily over the counter med use (Tylenol, excedrin, ibuprofen, alleve) Do not tak emore than 2-3x in one week -At onset of Migraine reglan.  - Can increase Topiramate, discussed cgrp and botox. She prefers a low dose, advised we can further increase.   As far as diagnostic testing: If headaches persist need MRI brain  Discussed; To prevent or relieve headaches, try the following:  Cool Compress. Lie down and place a cool compress on your head.   Avoid headache triggers. If certain foods or odors seem to have triggered your migraines  in the past, avoid them. A headache diary might help you identify triggers.   Include physical activity  in your daily routine. Try a daily walk or other moderate aerobic exercise.   Manage stress. Find healthy ways to cope with the stressors, such as delegating tasks on your to-do list.   Practice relaxation techniques. Try deep breathing, yoga, massage and visualization.   Eat regularly. Eating regularly scheduled meals and maintaining a healthy diet might help prevent headaches. Also, drink plenty of fluids.   Follow a regular sleep schedule. Sleep deprivation might contribute to headaches  Consider biofeedback. With this mind-body technique, you learn to control certain bodily functions -- such as muscle tension, heart rate and blood pressure -- to prevent headaches or reduce headache pain.    Proceed to emergency room if you experience new or worsening symptoms or symptoms do not resolve, if you have new neurologic symptoms or if headache is severe, or for any concerning symptom.    Cc: Dian Queen, MD,    Sarina Ill, MD  [facility] [address] [place], [place] [address]  Phone [phone] Fax [phone]

## 2018-12-10 ENCOUNTER — Encounter: Payer: Self-pay | Admitting: Neurology

## 2018-12-10 DIAGNOSIS — G4440 Drug-induced headache, not elsewhere classified, not intractable: Secondary | ICD-10-CM | POA: Insufficient documentation

## 2018-12-14 ENCOUNTER — Telehealth: Payer: Self-pay | Admitting: Neurology

## 2018-12-14 NOTE — Telephone Encounter (Signed)
[employer] order sent to GI. They will obtain the auth and reach out to the patient to schedule.  °

## 2018-12-20 ENCOUNTER — Other Ambulatory Visit: Payer: Self-pay | Admitting: Neurology

## 2018-12-20 MED ORDER — TOPIRAMATE 25 MG PO TABS: 25.0000 mg | ORAL_TABLET | Freq: Every day | ORAL | 3 refills | Status: DC

## 2019-03-15 ENCOUNTER — Ambulatory Visit (INDEPENDENT_AMBULATORY_CARE_PROVIDER_SITE_OTHER): Payer: Managed Care, Other (non HMO) | Admitting: Neurology

## 2019-03-15 ENCOUNTER — Encounter: Payer: Self-pay | Admitting: Neurology

## 2019-03-15 ENCOUNTER — Other Ambulatory Visit: Payer: Self-pay

## 2019-03-15 VITALS — BP 102/68 | HR 80 | Temp 97.7°F | Ht 62.0 in | Wt 124.0 lb

## 2019-03-15 DIAGNOSIS — G43709 Chronic migraine without aura, not intractable, without status migrainosus: Secondary | ICD-10-CM | POA: Diagnosis not present

## 2019-03-15 MED ORDER — TOPIRAMATE 25 MG PO TABS: 25.0000 mg | ORAL_TABLET | Freq: Every day | ORAL | 3 refills | Status: DC

## 2019-03-15 MED ORDER — METOCLOPRAMIDE HCL 10 MG PO TABS: 10.0000 mg | ORAL_TABLET | Freq: Three times a day (TID) | ORAL | 11 refills | Status: DC | PRN

## 2019-03-15 NOTE — Progress Notes (Signed)
[facility]    Provider:  Dr Lucia Gaskins Requesting Provider: Marcelle Overlie, MD Primary Care Provider:  Marcelle Overlie, MD  CC:  Migraine  Interval history March 15, 2019: Patient is here today for follow-up of her migraines, I last appointment she was given Reglan acutely and started on low-dose topiramate.  MRI of the brain was ordered at last appointment however it has not been completed.  The Reglan helps acutely. She is improving. She is not taking ibuprofen as much.  She used 30 tablets in the last 3 month only 10 a month, she just refilled it, will refill for a year.   HPI:  Connie Roach is a 39 y.o. female here as requested by Marcelle Overlie, MD for migraines and tension type headache.  Past medical history depression, migraines. Mother with migraines. Patient has had them for years maybe since college or sooner. They start behind the eyes, +blurry vision,  pulsating/pounding/throbbing, photo/phonophobia, taking ibuprofen daily, occ nausea, daily headaches, no vomiting. >8 migraine days a month sleep helps, last >24 hours and can be moderately severe or severe, sound makes it worse, stress makes it worse, no head trauma, headaches can be positional and she can wake up with headaches, movement or bending over makes it worse and worsens throughout the day especially with exertion.   Reviewed notes, labs and imaging from outside physicians, which showed:   I reviewed emergency room notes.  She complained of headache that started the night prior without any precipitating event or head trauma.  She tried oral sumatriptan without relief.  Photophobia phonophobia.  Similar symptoms in the past.  Frontal predominant.  Also dizziness, feels like she is moving while standing, worse with sitting movements and position change.  Also popping of ears.  Examination was normal including neurologic examination.  Diagnosed with migraine without aura and vertigo given Reglan, Decadron  and Toradol.  I not see any labs since 2011 however, in 2011 she did have anemia and elevated white blood cells, RPR was negative.  I also reviewed referring physician's notes which included no information on migraines except that patient requested an appointment with neurology.   Review of Systems: Patient complains of symptoms per HPI as well as the following symptoms headache. Pertinent negatives and positives per HPI. All others negative.   Social History   Socioeconomic History  . Marital status: Married    Spouse name: separated, not legally  . Number of children: 2  . Years of education: Not on file  . Highest education level: Bachelor's degree (e.g., BA, AB, BS)  Occupational History  . Not on file  Tobacco Use  . Smoking status: Current Every Day Smoker  . Smokeless tobacco: Never Used  . Tobacco comment: 2 packs per week  Substance and Sexual Activity  . Alcohol use: Yes    Alcohol/week: 4.0 standard drinks    Types: 4 Standard drinks or equivalent per week  . Drug use: Yes    Frequency: 1.0 times per week    Types: Marijuana  . Sexual activity: Not on file  Other Topics Concern  . Not on file  Social History Narrative   Lives at home with her daughters   Right handed   Caffeine: 1-2 cups/day   Social Determinants of Health   Financial Resource Strain:   . Difficulty of Paying Living Expenses: Not on file  Food Insecurity:   . Worried About Programme researcher, broadcasting/film/video in the Last Year: Not on file  .  Ran Out of Food in the Last Year: Not on file  Transportation Needs:   . Lack of Transportation (Medical): Not on file  . Lack of Transportation (Non-Medical): Not on file  Physical Activity:   . Days of Exercise per Week: Not on file  . Minutes of Exercise per Session: Not on file  Stress:   . Feeling of Stress : Not on file  Social Connections:   . Frequency of Communication with Friends and Family: Not on file  . Frequency of Social Gatherings with Friends and  Family: Not on file  . Attends Religious Services: Not on file  . Active Member of Clubs or Organizations: Not on file  . Attends Banker Meetings: Not on file  . Marital Status: Not on file  Intimate Partner Violence:   . Fear of Current or Ex-Partner: Not on file  . Emotionally Abused: Not on file  . Physically Abused: Not on file  . Sexually Abused: Not on file    Family History  Problem Relation Age of Onset  . Headache Mother   . Emphysema Maternal Grandfather   . Breast cancer Paternal Grandmother 72  . Leukemia Paternal Grandfather   . Breast cancer Paternal Aunt 49    Past Medical History:  Diagnosis Date  . Anxiety   . Depression   . Migraine     Patient Active Problem List   Diagnosis Date Noted  . Medication overuse headache 12/10/2018  . Chronic migraine without aura without status migrainosus, not intractable 12/09/2018  . Acute bacterial sinusitis 11/10/2013  . Left ear pain 10/30/2010    Past Surgical History:  Procedure Laterality Date  . ESSURE TUBAL LIGATION  2011  . uterine ablation  2018    Current Outpatient Medications  Medication Sig Dispense Refill  . ALPRAZolam (XANAX) 0.25 MG tablet TAKE 1 TABLET BY MOUTH 1 TIME A DAY AS NEEDED ANXIETY/PANIC ATTACK    . Doxylamine Succinate, Sleep, (SLEEP AID PO) Take by mouth.    Marland Kitchen ibuprofen (ADVIL) 200 MG tablet Take 400 mg by mouth daily as needed.    . metoCLOPramide (REGLAN) 10 MG tablet Take 1 tablet (10 mg total) by mouth 3 (three) times daily as needed. For migraine or headache. 30 tablet 11  . sertraline (ZOLOFT) 100 MG tablet Take 150 mg by mouth daily.    Marland Kitchen topiramate (TOPAMAX) 25 MG tablet Take 1 tablet (25 mg total) by mouth at bedtime. 90 tablet 3   No current facility-administered medications for this visit.    Allergies as of 03/15/2019  . (No Known Allergies)    Vitals: BP 102/68 (BP Location: Right Arm, Patient Position: Sitting)   Pulse 80   Temp 97.7 F (36.5 C)  Comment: taken at front  Ht 5\' 2"  (1.575 m)   Wt 124 lb (56.2 kg)   BMI 22.68 kg/m  Last Weight:  Wt Readings from Last 1 Encounters:  03/15/19 124 lb (56.2 kg)   Last Height:   Ht Readings from Last 1 Encounters:  03/15/19 5\' 2"  (1.575 m)     Physical exam: Exam: Gen: NAD, conversant, well nourised, well groomed                     CV: RRR, no MRG. No Carotid Bruits. No peripheral edema, warm, nontender Eyes: Conjunctivae clear without exudates or hemorrhage  Neuro: Detailed Neurologic Exam  Speech:    Speech is normal; fluent and spontaneous with normal comprehension.  Cognition:    The patient is oriented to person, place, and time;     recent and remote memory intact;     language fluent;     normal attention, concentration,     fund of knowledge Cranial Nerves:    The pupils are equal, round, and reactive to light. The fundi are normal and spontaneous venous pulsations are present. Visual fields are full to finger confrontation. Extraocular movements are intact. Trigeminal sensation is intact and the muscles of mastication are normal. The face is symmetric. The palate elevates in the midline. Hearing intact. Voice is normal. Shoulder shrug is normal. The tongue has normal motion without fasciculations.   Coordination:    Normal finger to nose and heel to shin. Normal rapid alternating movements.   Gait:    Heel-toe and tandem gait are normal.   Motor Observation:    No asymmetry, no atrophy, and no involuntary movements noted. Tone:    Normal muscle tone.    Posture:    Posture is normal. normal erect    Strength:    Strength is V/V in the upper and lower limbs.      Sensation: intact to LT     Reflex Exam:  DTR's:    Deep tendon reflexes in the upper and lower extremities are normal bilaterally.   Toes:    The toes are downgoing bilaterally.   Clonus:    Clonus is absent.    Assessment/Plan:  Chronic daily headaches likely due to Migraines and  medication overuse  But given concerning symptoms she needs a thorough evaluation including MRI brain. I had a long discussion with patient that her daily use of ibuprofen or Tylenol can cause medication overuse/rebound headache which is contributing to her chronic daily headaches. They only thing to do is to stop the medication unfortunately. In the timeframe after stopping at her headaches may get much worse. I will give her some medication to try to bridge that. She should significantly  improve with her chronic daily headaches after 2-4 weeks of being off her daily over-the-counter medications. Do not use these medications more than 2 times in a week.  - She is doing exceptional on Topamax, stopped overusing OTC meds, Reglan working acutely. She can follow up as needed and if continues to do well she can ask her pcp or obgyn to refill topamax so she doesn't have to pay a specialist appointment but I am always available via email or if she would like to follow up with me anytime.   Migraines: Discussed migraine management including acute management and preventative management. We'll start patient on both. Discussed Topamax side effects especially teratogenicity do not get pregnant and use birth control.  Meds ordered this encounter  Medications  . topiramate (TOPAMAX) 25 MG tablet    Sig: Take 1 tablet (25 mg total) by mouth at bedtime.    Dispense:  90 tablet    Refill:  3  . metoCLOPramide (REGLAN) 10 MG tablet    Sig: Take 1 tablet (10 mg total) by mouth 3 (three) times daily as needed. For migraine or headache.    Dispense:  30 tablet    Refill:  11     Remember to drink plenty of fluid, eat healthy meals and do not skip any meals. Try to eat protein with a every meal and eat a healthy snack such as fruit or nuts in between meals. Try to keep a regular sleep-wake schedule and try to exercise daily,  particularly in the form of walking, 20-30 minutes a day, if you can.   As far as your  medications are concerned, I would like to suggest: - Stop daily over the counter med use (Tylenol, excedrin, ibuprofen, alleve) Do not tak emore than 2-3x in one week -At onset of Migraine reglan.  - Can increase Topiramate, discussed cgrp and botox. She prefers a low dose, advised we can further increase.   As far as diagnostic testing: If headaches persist need MRI brain  Discussed; To prevent or relieve headaches, try the following:  Cool Compress. Lie down and place a cool compress on your head.   Avoid headache triggers. If certain foods or odors seem to have triggered your migraines in the past, avoid them. A headache diary might help you identify triggers.   Include physical activity in your daily routine. Try a daily walk or other moderate aerobic exercise.   Manage stress. Find healthy ways to cope with the stressors, such as delegating tasks on your to-do list.   Practice relaxation techniques. Try deep breathing, yoga, massage and visualization.   Eat regularly. Eating regularly scheduled meals and maintaining a healthy diet might help prevent headaches. Also, drink plenty of fluids.   Follow a regular sleep schedule. Sleep deprivation might contribute to headaches  Consider biofeedback. With this mind-body technique, you learn to control certain bodily functions -- such as muscle tension, heart rate and blood pressure -- to prevent headaches or reduce headache pain.    Proceed to emergency room if you experience new or worsening symptoms or symptoms do not resolve, if you have new neurologic symptoms or if headache is severe, or for any concerning symptom.    Cc: Dian Queen, MD,    A total of 15 minutes was spent on this patient's care, reviewing imaging, past records, recent hospitalization notes and results. Over half this time was spent on counseling patient on the  1. Chronic migraine without aura without status migrainosus, not intractable    diagnosis  and different diagnostic and therapeutic options, counseling and coordination of care, risks and benefitsof management, compliance, or risk factor reduction and education.     Sarina Ill, MD  [facility] [address] [place], [place] [address]  Phone [phone] Fax [phone]

## 2019-05-24 ENCOUNTER — Other Ambulatory Visit: Payer: Self-pay | Admitting: *Deleted

## 2019-05-24 MED ORDER — TOPIRAMATE 25 MG PO TABS: 25.0000 mg | ORAL_TABLET | Freq: Every day | ORAL | 2 refills | Status: DC

## 2019-05-24 NOTE — Telephone Encounter (Signed)
Request from [facility] for topiramate 25 mg tablet refill #90.

## 2019-11-02 DIAGNOSIS — R87612 Low grade squamous intraepithelial lesion on cytologic smear of cervix (LGSIL): Secondary | ICD-10-CM | POA: Insufficient documentation

## 2020-01-08 ENCOUNTER — Emergency Department (HOSPITAL_COMMUNITY)
Admission: EM | Admit: 2020-01-08 | Discharge: 2020-01-08 | Disposition: A | Discharge status: Home / Self Care | Payer: Managed Care, Other (non HMO) | Attending: Emergency Medicine | Admitting: Emergency Medicine

## 2020-01-08 ENCOUNTER — Encounter (HOSPITAL_COMMUNITY): Payer: Self-pay | Admitting: Emergency Medicine

## 2020-01-08 ENCOUNTER — Other Ambulatory Visit: Payer: Self-pay

## 2020-01-08 DIAGNOSIS — N939 Abnormal uterine and vaginal bleeding, unspecified: Secondary | ICD-10-CM | POA: Diagnosis present

## 2020-01-08 DIAGNOSIS — F17200 Nicotine dependence, unspecified, uncomplicated: Secondary | ICD-10-CM | POA: Insufficient documentation

## 2020-01-08 LAB — CBC
HCT: 43.9 % (ref 36.0–46.0)
Hemoglobin: 14 g/dL (ref 12.0–15.0)
MCH: 30.9 pg (ref 26.0–34.0)
MCHC: 31.9 g/dL (ref 30.0–36.0)
MCV: 96.9 fL (ref 80.0–100.0)
Platelets: 253 10*3/uL (ref 150–400)
RBC: 4.53 MIL/uL (ref 3.87–5.11)
RDW: 12.9 % (ref 11.5–15.5)
WBC: 14.8 10*3/uL — ABNORMAL HIGH (ref 4.0–10.5)
nRBC: 0 % (ref 0.0–0.2)

## 2020-01-08 LAB — I-STAT CHEM 8, ED
BUN: 11 mg/dL (ref 6–20)
Calcium, Ion: 1.25 mmol/L (ref 1.15–1.40)
Chloride: 102 mmol/L (ref 98–111)
Creatinine, Ser: 0.7 mg/dL (ref 0.44–1.00)
Glucose, Bld: 105 mg/dL — ABNORMAL HIGH (ref 70–99)
HCT: 46 % (ref 36.0–46.0)
Hemoglobin: 15.6 g/dL — ABNORMAL HIGH (ref 12.0–15.0)
Potassium: 3.1 mmol/L — ABNORMAL LOW (ref 3.5–5.1)
Sodium: 139 mmol/L (ref 135–145)
TCO2: 22 mmol/L (ref 22–32)

## 2020-01-08 LAB — BASIC METABOLIC PANEL
Anion gap: 9 (ref 5–15)
BUN: 9 mg/dL (ref 6–20)
CO2: 24 mmol/L (ref 22–32)
Calcium: 9.4 mg/dL (ref 8.9–10.3)
Chloride: 103 mmol/L (ref 98–111)
Creatinine, Ser: 0.77 mg/dL (ref 0.44–1.00)
GFR, Estimated: >60 mL/min (ref >60)
Glucose, Bld: 113 mg/dL — ABNORMAL HIGH (ref 70–99)
Potassium: 3 mmol/L — ABNORMAL LOW (ref 3.5–5.1)
Sodium: 136 mmol/L (ref 135–145)

## 2020-01-08 LAB — HEMOGLOBIN AND HEMATOCRIT, BLOOD
HCT: 42.7 % (ref 36.0–46.0)
Hemoglobin: 13.5 g/dL (ref 12.0–15.0)

## 2020-01-08 LAB — TYPE AND SCREEN
ABO/RH(D): O POS
Antibody Screen: NEGATIVE

## 2020-01-08 LAB — PROTIME-INR
INR: 1 (ref 0.8–1.2)
Prothrombin Time: 13 seconds (ref 11.4–15.2)

## 2020-01-08 LAB — I-STAT BETA HCG BLOOD, ED (MC, WL, AP ONLY): I-stat hCG, quantitative: <5 m[IU]/mL (ref <5)

## 2020-01-08 LAB — APTT: aPTT: 27 s (ref 24–36)

## 2020-01-08 MED ORDER — LACTATED RINGERS IV BOLUS
1000.0000 mL | Freq: Once | INTRAVENOUS | Status: AC
  Administered 2020-01-08: 1000 mL via INTRAVENOUS

## 2020-01-08 MED ORDER — MONSELS FERRIC SUBSULFATE EX SOLN
Freq: Once | CUTANEOUS | Status: DC
  Filled 2020-01-08 (×2): qty 8

## 2020-01-08 MED ORDER — BUTORPHANOL TARTRATE 1 MG/ML IJ SOLN: 1.0000 mg | Freq: Once | INTRAMUSCULAR | Status: DC

## 2020-01-08 MED ORDER — SODIUM CHLORIDE 0.9 % IV BOLUS
1000.0000 mL | Freq: Once | INTRAVENOUS | Status: AC
  Administered 2020-01-08: 1000 mL via INTRAVENOUS

## 2020-01-08 MED ORDER — TRANEXAMIC ACID-NACL 1000-0.7 MG/100ML-% IV SOLN
1000.0000 mg | Freq: Once | INTRAVENOUS | Status: AC
  Administered 2020-01-08: 1000 mg via INTRAVENOUS
  Filled 2020-01-08: qty 100

## 2020-01-08 MED ORDER — LIDOCAINE HCL (PF) 1 % IJ SOLN
INTRAMUSCULAR | Status: AC
  Filled 2020-01-08: qty 30

## 2020-01-08 NOTE — Consult Note (Addendum)
Reason for Consult:Heavy vaginal bleeding s/p LEEP Referring Physician: ED Dr. Dot Lanes Connie Roach is an 39 y.o. female. Presenting for sudden onset of heavy vaginal bleeding. She underwent an uncomplicated LEEP by Dr. Vincente Poli in our office 3 days ago. Minimal bleeding at the time of the LEEP and the first few hours after. Bleeding picked up yesterday when patient started to require a pad. This morning she woke up early and noticed more bleeding but not severe. 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 She immediately presented to the ED. She had dizziness at first but now reports feeling better. Denies cramping, fever, or chills.   Pertinent Gynecological History: Menses: s/p ablation, amenorrea usually Bleeding: none until now Contraception: essure DES exposure: denies Blood transfusions: none Sexually transmitted diseases: HPV Previous GYN Procedures: LEEP  Last pap: abnormal: ASCUS, colpo CIN 3 Date: 2021 OB History: G2, P2002 - SVD x 2 - two girls.    Menstrual History: No LMP recorded. Patient has had an ablation.    Past Medical History:  Diagnosis Date  . Anxiety   . Depression   . Migraine     Past Surgical History:  Procedure Laterality Date  . ESSURE TUBAL LIGATION  2011  . uterine ablation  2018    Family History  Problem Relation Age of Onset  . Headache Mother   . Emphysema Maternal Grandfather   . Breast cancer Paternal Grandmother 20  . Leukemia Paternal Grandfather   . Breast cancer Paternal Aunt 78    Social History:  reports that she has been smoking. She has never used smokeless tobacco. She reports current alcohol use of about 4.0 standard drinks of alcohol per week. She reports current drug use. Frequency: 1.00 time per week. Drug: Marijuana.  Allergies: No Known Allergies  Medications:  I have reviewed  the patient's current medications. Prior to Admission: (Not in a hospital admission)  Scheduled: . ferric subsulfate   Topical Once  . lidocaine (PF)        Review of Systems  Blood pressure (!) 129/111, pulse 82, temperature 98 F (36.7 C), temperature source Oral, resp. rate 14, height 5\' 2"  (1.575 m), weight 54 kg, SpO2 97 %. Physical Exam  Gen: well appearing but anxious, sitting on a chux pad soaked in blood CV: Reg rate Pulm: NWOB Abd: soft, nondistended, nontender, no masses GYN: uterus 6 week size. Vaginal filled with ~800cc of clot, all removed. LEEP bed noted to have one Helget area at 10 oclock position that is actively bleeding. Rest of LEEP bed completely hemostatic.  Ext: No edema b/l   Results for orders placed or performed during the hospital encounter of 01/08/20 (from the past 48 hour(s))  Type and screen [facility]     Status: None   Collection Time: 01/08/20  1:01 PM  Result Value Ref Range   ABO/RH(D) O POS    Antibody Screen NEG    Sample Expiration      01/11/2020,2359 Performed at [facility], [address] [place], [place]   I-Stat Beta hCG blood, ED ([facility], [facility], [facility] only)     Status: None   Collection Time: 01/08/20  1:06 PM  Result Value Ref Range   I-stat hCG, quantitative <5.0 <5 mIU/mL   Comment 3  Comment:   GEST. AGE      CONC.  (mIU/mL)   <=1 WEEK        5 - 50     2 WEEKS       50 - 500     3 WEEKS       100 - 10,000     4 WEEKS     1,000 - 30,000        FEMALE AND NON-PREGNANT FEMALE:     LESS THAN 5 mIU/mL   I-stat chem 8, ED (not at [facility] or [facility])     Status: Abnormal   Collection Time: 01/08/20  1:07 PM  Result Value Ref Range   Sodium 139 135 - 145 mmol/L   Potassium 3.1 (L) 3.5 - 5.1 mmol/L   Chloride 102 98 - 111 mmol/L   BUN 11 6 - 20 mg/dL   Creatinine, Ser 9.32 0.44 - 1.00 mg/dL   Glucose, Bld 355 (H) 70 - 99 mg/dL    Comment: Glucose reference range applies only to samples taken  after fasting for at least 8 hours.   Calcium, Ion 1.25 1.15 - 1.40 mmol/L   TCO2 22 22 - 32 mmol/L   Hemoglobin 15.6 (H) 12.0 - 15.0 g/dL   HCT 73.2 36 - 46 %  CBC     Status: Abnormal   Collection Time: 01/08/20  1:08 PM  Result Value Ref Range   WBC 14.8 (H) 4.0 - 10.5 K/uL   RBC 4.53 3.87 - 5.11 MIL/uL   Hemoglobin 14.0 12.0 - 15.0 g/dL   HCT 20.2 36 - 46 %   MCV 96.9 80.0 - 100.0 fL   MCH 30.9 26.0 - 34.0 pg   MCHC 31.9 30.0 - 36.0 g/dL   RDW 54.2 70.6 - 23.7 %   Platelets 253 150 - 400 K/uL   nRBC 0.0 0.0 - 0.2 %    Comment: Performed at [facility], [address], [place], [place] [address]  Basic metabolic panel     Status: Abnormal   Collection Time: 01/08/20  1:08 PM  Result Value Ref Range   Sodium 136 135 - 145 mmol/L   Potassium 3.0 (L) 3.5 - 5.1 mmol/L   Chloride 103 98 - 111 mmol/L   CO2 24 22 - 32 mmol/L   Glucose, Bld 113 (H) 70 - 99 mg/dL    Comment: Glucose reference range applies only to samples taken after fasting for at least 8 hours.   BUN 9 6 - 20 mg/dL   Creatinine, Ser 5.17 0.44 - 1.00 mg/dL   Calcium 9.4 8.9 - 61.6 mg/dL   GFR, Estimated >07 >37 mL/min    Comment: (NOTE) Calculated using the CKD-EPI Creatinine Equation (2021)    Anion gap 9 5 - 15    Comment: Performed at [facility], [address], [place], [place] [address]  Protime-INR     Status: None   Collection Time: 01/08/20  1:08 PM  Result Value Ref Range   Prothrombin Time 13.0 11.4 - 15.2 seconds   INR 1.0 0.8 - 1.2    Comment: (NOTE) INR goal varies based on device and disease states. Performed at [facility], [address], [place], [place] [address]   APTT     Status: None   Collection Time: 01/08/20  1:08 PM  Result Value Ref Range   aPTT 27 24 - 36 seconds    Comment: Performed at [facility]  [facility], [address], [place], [place] [address]    No results found.  Assessment/Plan: 39 yo G2P2 POD#4 s/p uncomplicated LEEP procedure presenting  with delayed and active bleeding.  EBL unable to be assesses prior to arrival. Since being here, approximately ~1500cc.  Hgb 14 on arrival. Type and crossed x 2 upRPC. I discussed options while holding pressure to the area including for bleeding including placing a stitch in area of active bleeding under local and bringing her to the OR for correction there with stitches or cautery. We discussed the possibility of requiring a hsyterectomy. After careful consideration, patient elects bedside attempt at suturing under local.   Update: Pressure held with a fox swab x 20 minutes while awaiting supplies to help stop bleeding. Bleeding noted to be completely stopped. Everything was removed from the vagina.  30 minutes later: Patient re-evaluate with speculum exam to ensure LEEP bed truly hemostatic with just pressure. Only one fox swab used to dab area and NO active bleeding noted. Unable to even assess where prior active bleeding was because area is now hemostatic. I then watched the area under direct observation for an additional 15 minutes. NO FURTHER BLEEDING.  Repeat hgb 13.5 thus essentially stable.  TCX 1g to be given to help prevent further bleeding.  If patient remains without heavy bleeding an hour after TXA finishes this will be ~ 3 hours stable so consider dc home.  We reviewed return precautions in strict detail. She will follow up with the office on Monday with Dr. Vincente Poli.    Ranae Pila 01/08/2020

## 2020-01-08 NOTE — ED Notes (Signed)
Dr. Elon Spanner at bedside.

## 2020-01-08 NOTE — ED Provider Notes (Signed)
Pt has remained stable.  No further bleeding.  Ready for discharge.   Linwood Dibbles, MD 01/08/20 [phone]

## 2020-01-08 NOTE — ED Notes (Signed)
Dr. Elon Spanner doing pelvic exam to check bleeding status.

## 2020-01-08 NOTE — ED Triage Notes (Signed)
Patient arrives to ED with complaints of heavy vaginal bleeding starting this morning. Pt states that she had a LEEP procedure done on Wednesday. Hx of abnormal PAP. Pt states she feels woozy and lethargic today. Denies abdominal pain.

## 2020-01-08 NOTE — ED Notes (Addendum)
Pt went to the restroom approx. 45 minutes ago & reported a Pio clot when she urinated, then she reports no blood at all just now when she went to the bathroom, she showed this RN a picture of the toilet that had no blood in it as "proof" (per pt).

## 2020-01-08 NOTE — ED Provider Notes (Signed)
[facility] EMERGENCY DEPARTMENT Provider Note   CSN: [id number] Arrival date & time: 01/08/20  1217     History Chief Complaint  Patient presents with  . Vaginal Bleeding    Connie Roach is a 39 y.o. female.   Vaginal Bleeding Quality:  Bright red Severity:  Severe Onset quality:  Gradual Timing:  Constant Progression:  Worsening Chronicity:  New Context comment:  Recent LEEP Relieved by:  Nothing Worsened by:  Nothing Ineffective treatments:  None tried Associated symptoms: no back pain, no dysuria, no fever, no nausea and no vaginal discharge   Risk factors: no bleeding disorder        Past Medical History:  Diagnosis Date  . Anxiety   . Depression   . Migraine     Patient Active Problem List   Diagnosis Date Noted  . Medication overuse headache 12/10/2018  . Chronic migraine without aura without status migrainosus, not intractable 12/09/2018  . Acute bacterial sinusitis 11/10/2013  . Left ear pain 10/30/2010    Past Surgical History:  Procedure Laterality Date  . ESSURE TUBAL LIGATION  2011  . uterine ablation  2018     OB History   No obstetric history on file.     Family History  Problem Relation Age of Onset  . Headache Mother   . Emphysema Maternal Grandfather   . Breast cancer Paternal Grandmother 8  . Leukemia Paternal Grandfather   . Breast cancer Paternal Aunt 35    Social History   Tobacco Use  . Smoking status: Current Every Day Smoker  . Smokeless tobacco: Never Used  . Tobacco comment: 2 packs per week  Vaping Use  . Vaping Use: Never used  Substance Use Topics  . Alcohol use: Yes    Alcohol/week: 4.0 standard drinks    Types: 4 Standard drinks or equivalent per week  . Drug use: Yes    Frequency: 1.0 times per week    Types: Marijuana    Home Medications Prior to Admission medications   Medication Sig Start Date End Date Taking? Authorizing Provider  ALPRAZolam (XANAX) 0.25 MG tablet TAKE 1  TABLET BY MOUTH 1 TIME A DAY AS NEEDED ANXIETY/PANIC ATTACK 11/18/18   [provider]  Doxylamine Succinate, Sleep, (SLEEP AID PO) Take by mouth.    [provider]  ibuprofen (ADVIL) 200 MG tablet Take 400 mg by mouth daily as needed.    [provider]  metoCLOPramide (REGLAN) 10 MG tablet Take 1 tablet (10 mg total) by mouth 3 (three) times daily as needed. For migraine or headache. 03/15/19   Anson Fret, MD  sertraline (ZOLOFT) 100 MG tablet Take 150 mg by mouth daily. 10/29/18   [provider]  topiramate (TOPAMAX) 25 MG tablet Take 1 tablet (25 mg total) by mouth at bedtime. 05/24/19   Anson Fret, MD    Allergies    Patient has no known allergies.  Review of Systems   Review of Systems  Constitutional: Negative for chills and fever.  HENT: Negative for congestion and rhinorrhea.   Respiratory: Negative for cough and shortness of breath.   Cardiovascular: Negative for chest pain and palpitations.  Gastrointestinal: Negative for diarrhea, nausea and vomiting.  Genitourinary: Positive for vaginal bleeding. Negative for difficulty urinating, dysuria and vaginal discharge.  Musculoskeletal: Negative for arthralgias and back pain.  Skin: Negative for rash and wound.  Neurological: Positive for light-headedness. Negative for headaches.    Physical Exam Updated Vital  Signs BP 100/62 (BP Location: Right Arm)   Pulse 68   Temp 98.4 F (36.9 C) (Oral)   Resp 16   Ht 5\' 2"  (1.575 m)   Wt 54 kg   SpO2 94%   BMI 21.77 kg/m   Physical Exam Vitals and nursing note reviewed. Exam conducted with a chaperone present.  Constitutional:      General: She is not in acute distress.    Appearance: Normal appearance.  HENT:     Head: Normocephalic and atraumatic.     Nose: No rhinorrhea.  Eyes:     General:        Right eye: No discharge.        Left eye: No discharge.     Conjunctiva/sclera: Conjunctivae normal.  Cardiovascular:     Rate  and Rhythm: Normal rate and regular rhythm.  Pulmonary:     Effort: Pulmonary effort is normal. No respiratory distress.     Breath sounds: No stridor.  Abdominal:     General: Abdomen is flat. There is no distension.     Palpations: Abdomen is soft.     Tenderness: There is no abdominal tenderness.  Genitourinary:    Comments: Active bright red blood per vagina, internal exam deferred for gynecologist. Musculoskeletal:        General: No tenderness or signs of injury.  Skin:    General: Skin is warm and dry.  Neurological:     General: No focal deficit present.     Mental Status: She is alert. Mental status is at baseline.     Motor: No weakness.  Psychiatric:        Mood and Affect: Mood normal.        Behavior: Behavior normal.     ED Results / Procedures / Treatments   Labs (all labs ordered are listed, but only abnormal results are displayed) Labs Reviewed  CBC - Abnormal; Notable for the following components:      Result Value   WBC 14.8 (*)    All other components within normal limits  BASIC METABOLIC PANEL - Abnormal; Notable for the following components:   Potassium 3.0 (*)    Glucose, Bld 113 (*)    All other components within normal limits  I-STAT CHEM 8, ED - Abnormal; Notable for the following components:   Potassium 3.1 (*)    Glucose, Bld 105 (*)    Hemoglobin 15.6 (*)    All other components within normal limits  PROTIME-INR  APTT  HEMOGLOBIN AND HEMATOCRIT, BLOOD  I-STAT BETA HCG BLOOD, ED ([facility], [facility], [facility] ONLY)  TYPE AND SCREEN    EKG EKG Interpretation  Date/Time:  Saturday January 08 2020 12:56:02 EST Ventricular Rate:  73 PR Interval:    QRS Duration: 82 QT Interval:  366 QTC Calculation: 404 R Axis:   86 Text Interpretation: Sinus rhythm Borderline short PR interval Consider left ventricular hypertrophy Confirmed by 11-03-1992 (Cherlynn Perches) on 01/08/2020 1:06:41 PM   Radiology No results found.  Procedures Procedures (including critical  care time)  Medications Ordered in ED Medications  sodium chloride 0.9 % bolus 1,000 mL (0 mLs Intravenous Stopped 01/08/20 1331)  tranexamic acid (CYKLOKAPRON) IVPB 1,000 mg (0 mg Intravenous Stopped 01/08/20 1451)    Followed by  tranexamic acid (CYKLOKAPRON) IVPB 1,000 mg (0 mg Intravenous Stopped 01/08/20 1512)  lactated ringers bolus 1,000 mL (0 mLs Intravenous Stopped 01/08/20 1710)    ED Course  I have reviewed the triage vital signs  and the nursing notes.  Pertinent labs & imaging results that were available during my care of the patient were reviewed by me and considered in my medical decision making (see chart for details).    MDM Rules/Calculators/A&P                          History of LEEP, vaginal bleeding, patient was lightheaded.  Initial vital signs stable.  [facility] consulted emergently, they will come in.  They recommend IV tranexamic acid.  They recommend getting pelvic cart at bedside for them to do intervention.  Stat labs are sent.  IV fluids were started.  Patient is well-appearing overall.  EKG reviewed by myself shows sinus rhythm without acute ischemic change interval abnormality or arrhythmia.  Gynecologist at bedside and performed hemostatic procedures, per their review they held pressure for significant at a time.  This stopped the bleeding.  Hemoglobin was stable after review and gynecology's recommendation was to observe the patient for several hours after the procedure.  Patient agrees this will be observed. Final Clinical Impression(s) / ED Diagnoses Final diagnoses:  Abnormal vaginal bleeding    Rx / DC Orders ED Discharge Orders    None       Sabino Donovan, MD 01/09/20 [phone]

## 2020-01-08 NOTE — Discharge Instructions (Addendum)
Please follow up as recommended by Dr Elon Spanner

## 2020-02-28 ENCOUNTER — Other Ambulatory Visit: Payer: Self-pay | Admitting: Obstetrics and Gynecology

## 2020-02-28 DIAGNOSIS — N63 Unspecified lump in unspecified breast: Secondary | ICD-10-CM

## 2020-03-30 ENCOUNTER — Other Ambulatory Visit: Payer: Managed Care, Other (non HMO)

## 2020-04-26 ENCOUNTER — Other Ambulatory Visit: Payer: Managed Care, Other (non HMO)

## 2020-04-27 ENCOUNTER — Ambulatory Visit
Admission: RE | Admit: 2020-04-27 | Discharge: 2020-04-27 | Disposition: A | Discharge status: Home / Self Care | Payer: Managed Care, Other (non HMO) | Source: Ambulatory Visit | Attending: Obstetrics and Gynecology | Admitting: Obstetrics and Gynecology

## 2020-04-27 ENCOUNTER — Other Ambulatory Visit: Payer: Self-pay

## 2020-04-27 DIAGNOSIS — N63 Unspecified lump in unspecified breast: Secondary | ICD-10-CM

## 2020-05-29 NOTE — Progress Notes (Deleted)
[facility]    Provider:  Dr Lucia Gaskins Requesting Provider: Marcelle Overlie, MD Primary Care Provider:  Marcelle Overlie, MD  CC:  Migraine  Interval history March 15, 2019: Patient is here today for follow-up of her migraines, I last appointment she was given Reglan acutely and started on low-dose topiramate.  MRI of the brain was ordered at last appointment however it has not been completed.  The Reglan helps acutely. She is improving. She is not taking ibuprofen as much.  She used 30 tablets in the last 3 month only 10 a month, she just refilled it, will refill for a year.    HPI:  Connie Roach is a 40 y.o. female here as requested by Marcelle Overlie, MD for migraines and tension type headache.  Past medical history depression, migraines. Mother with migraines. Patient has had them for years maybe since college or sooner. They start behind the eyes, +blurry vision,  pulsating/pounding/throbbing, photo/phonophobia, taking ibuprofen daily, occ nausea, daily headaches, no vomiting. >8 migraine days a month sleep helps, last >24 hours and can be moderately severe or severe, sound makes it worse, stress makes it worse, no head trauma, headaches can be positional and she can wake up with headaches, movement or bending over makes it worse and worsens throughout the day especially with exertion.   Reviewed notes, labs and imaging from outside physicians, which showed:   I reviewed emergency room notes.  She complained of headache that started the night prior without any precipitating event or head trauma.  She tried oral sumatriptan without relief.  Photophobia phonophobia.  Similar symptoms in the past.  Frontal predominant.  Also dizziness, feels like she is moving while standing, worse with sitting movements and position change.  Also popping of ears.  Examination was normal including neurologic examination.  Diagnosed with migraine without aura and vertigo given Reglan,  Decadron and Toradol.  I not see any labs since 2011 however, in 2011 she did have anemia and elevated white blood cells, RPR was negative.  I also reviewed referring physician's notes which included no information on migraines except that patient requested an appointment with neurology.   Review of Systems: Patient complains of symptoms per HPI as well as the following symptoms headache. Pertinent negatives and positives per HPI. All others negative.   Social History   Socioeconomic History  . Marital status: Married    Spouse name: separated, not legally  . Number of children: 2  . Years of education: Not on file  . Highest education level: Bachelor's degree (e.g., BA, AB, BS)  Occupational History  . Not on file  Tobacco Use  . Smoking status: Current Every Day Smoker  . Smokeless tobacco: Never Used  . Tobacco comment: 2 packs per week  Vaping Use  . Vaping Use: Never used  Substance and Sexual Activity  . Alcohol use: Yes    Alcohol/week: 4.0 standard drinks    Types: 4 Standard drinks or equivalent per week  . Drug use: Yes    Frequency: 1.0 times per week    Types: Marijuana  . Sexual activity: Not on file  Other Topics Concern  . Not on file  Social History Narrative   Lives at home with her daughters   Right handed   Caffeine: 1-2 cups/day   Social Determinants of Health   Financial Resource Strain: Not on file  Food Insecurity: Not on file  Transportation Needs: Not on file  Physical Activity: Not on file  Stress: Not on file  Social Connections: Not on file  Intimate Partner Violence: Not on file    Family History  Problem Relation Age of Onset  . Headache Mother   . Emphysema Maternal Grandfather   . Breast cancer Paternal Grandmother 36  . Leukemia Paternal Grandfather   . Breast cancer Paternal Aunt 33    Past Medical History:  Diagnosis Date  . Anxiety   . Depression   . Migraine     Patient Active Problem List   Diagnosis Date Noted   . Medication overuse headache 12/10/2018  . Chronic migraine without aura without status migrainosus, not intractable 12/09/2018  . Acute bacterial sinusitis 11/10/2013  . Left ear pain 10/30/2010    Past Surgical History:  Procedure Laterality Date  . ESSURE TUBAL LIGATION  2011  . uterine ablation  2018    Current Outpatient Medications  Medication Sig Dispense Refill  . ALPRAZolam (XANAX) 0.25 MG tablet TAKE 1 TABLET BY MOUTH 1 TIME A DAY AS NEEDED ANXIETY/PANIC ATTACK    . Doxylamine Succinate, Sleep, (SLEEP AID PO) Take by mouth.    Marland Kitchen ibuprofen (ADVIL) 200 MG tablet Take 400 mg by mouth daily as needed.    . metoCLOPramide (REGLAN) 10 MG tablet Take 1 tablet (10 mg total) by mouth 3 (three) times daily as needed. For migraine or headache. 30 tablet 11  . sertraline (ZOLOFT) 100 MG tablet Take 150 mg by mouth daily.    Marland Kitchen topiramate (TOPAMAX) 25 MG tablet Take 1 tablet (25 mg total) by mouth at bedtime. 90 tablet 2   No current facility-administered medications for this visit.    Allergies as of 05/30/2020  . (No Known Allergies)    Vitals: There were no vitals taken for this visit. Last Weight:  Wt Readings from Last 1 Encounters:  01/08/20 119 lb (54 kg)   Last Height:   Ht Readings from Last 1 Encounters:  01/08/20 5\' 2"  (1.575 m)     Physical exam: Exam: Gen: NAD, conversant, well nourised, well groomed                     CV: RRR, no MRG. No Carotid Bruits. No peripheral edema, warm, nontender Eyes: Conjunctivae clear without exudates or hemorrhage  Neuro: Detailed Neurologic Exam  Speech:    Speech is normal; fluent and spontaneous with normal comprehension.  Cognition:    The patient is oriented to person, place, and time;     recent and remote memory intact;     language fluent;     normal attention, concentration,     fund of knowledge Cranial Nerves:    The pupils are equal, round, and reactive to light. The fundi are normal and spontaneous  venous pulsations are present. Visual fields are full to finger confrontation. Extraocular movements are intact. Trigeminal sensation is intact and the muscles of mastication are normal. The face is symmetric. The palate elevates in the midline. Hearing intact. Voice is normal. Shoulder shrug is normal. The tongue has normal motion without fasciculations.   Coordination:    Normal finger to nose and heel to shin. Normal rapid alternating movements.   Gait:    Heel-toe and tandem gait are normal.   Motor Observation:    No asymmetry, no atrophy, and no involuntary movements noted. Tone:    Normal muscle tone.    Posture:    Posture is normal. normal erect    Strength:    Strength is  V/V in the upper and lower limbs.      Sensation: intact to LT     Reflex Exam:  DTR's:    Deep tendon reflexes in the upper and lower extremities are normal bilaterally.   Toes:    The toes are downgoing bilaterally.   Clonus:    Clonus is absent.    Assessment/Plan:  Chronic daily headaches likely due to Migraines and medication overuse  But given concerning symptoms she needs a thorough evaluation including MRI brain. I had a long discussion with patient that her daily use of ibuprofen or Tylenol can cause medication overuse/rebound headache which is contributing to her chronic daily headaches. They only thing to do is to stop the medication unfortunately. In the timeframe after stopping at her headaches may get much worse. I will give her some medication to try to bridge that. She should significantly  improve with her chronic daily headaches after 2-4 weeks of being off her daily over-the-counter medications. Do not use these medications more than 2 times in a week.  - She is doing exceptional on Topamax, stopped overusing OTC meds, Reglan working acutely. She can follow up as needed and if continues to do well she can ask her pcp or obgyn to refill topamax so she doesn't have to pay a specialist  appointment but I am always available via email or if she would like to follow up with me anytime.   Migraines: Discussed migraine management including acute management and preventative management. We'll start patient on both. Discussed Topamax side effects especially teratogenicity do not get pregnant and use birth control.  No orders of the defined types were placed in this encounter.    Remember to drink plenty of fluid, eat healthy meals and do not skip any meals. Try to eat protein with a every meal and eat a healthy snack such as fruit or nuts in between meals. Try to keep a regular sleep-wake schedule and try to exercise daily, particularly in the form of walking, 20-30 minutes a day, if you can.   As far as your medications are concerned, I would like to suggest: - Stop daily over the counter med use (Tylenol, excedrin, ibuprofen, alleve) Do not tak emore than 2-3x in one week -At onset of Migraine reglan.  - Can increase Topiramate, discussed cgrp and botox. She prefers a low dose, advised we can further increase.   As far as diagnostic testing: If headaches persist need MRI brain  Discussed; To prevent or relieve headaches, try the following:  Cool Compress. Lie down and place a cool compress on your head.   Avoid headache triggers. If certain foods or odors seem to have triggered your migraines in the past, avoid them. A headache diary might help you identify triggers.   Include physical activity in your daily routine. Try a daily walk or other moderate aerobic exercise.   Manage stress. Find healthy ways to cope with the stressors, such as delegating tasks on your to-do list.   Practice relaxation techniques. Try deep breathing, yoga, massage and visualization.   Eat regularly. Eating regularly scheduled meals and maintaining a healthy diet might help prevent headaches. Also, drink plenty of fluids.   Follow a regular sleep schedule. Sleep deprivation might contribute  to headaches  Consider biofeedback. With this mind-body technique, you learn to control certain bodily functions -- such as muscle tension, heart rate and blood pressure -- to prevent headaches or reduce headache pain.    Proceed to  emergency room if you experience new or worsening symptoms or symptoms do not resolve, if you have new neurologic symptoms or if headache is severe, or for any concerning symptom.    Cc: Marcelle Overlie, MD,    A total of 15 minutes was spent on this patient's care, reviewing imaging, past records, recent hospitalization notes and results. Over half this time was spent on counseling patient on the  No diagnosis found. diagnosis and different diagnostic and therapeutic options, counseling and coordination of care, risks and benefitsof management, compliance, or risk factor reduction and education.     Naomie Dean, MD  [facility] [address] [place], [place] [address]  Phone [phone] Fax [phone]

## 2020-05-30 ENCOUNTER — Ambulatory Visit: Payer: Managed Care, Other (non HMO) | Admitting: Neurology

## 2020-05-30 ENCOUNTER — Encounter: Payer: Self-pay | Admitting: Neurology

## 2020-06-05 ENCOUNTER — Other Ambulatory Visit: Payer: Self-pay | Admitting: Neurology

## 2020-07-04 ENCOUNTER — Other Ambulatory Visit: Payer: Self-pay | Admitting: Neurology

## 2020-10-19 ENCOUNTER — Telehealth: Payer: Self-pay | Admitting: Neurology

## 2020-10-19 MED ORDER — TOPIRAMATE 25 MG PO TABS
25.0000 mg | ORAL_TABLET | Freq: Every day | ORAL | 0 refills | Status: DC
Start: 2020-10-19 — End: 2020-12-13

## 2020-10-19 NOTE — Telephone Encounter (Signed)
Per Dr Lucia Gaskins, I have placed a refill of Topamax 25 mg for 90 day supply. Pt has pending appt at the end of next month.

## 2020-10-19 NOTE — Telephone Encounter (Signed)
Pt is needing a refill request for her topiramate (TOPAMAX) 25 MG tablet sent in to the [facility] on [address]

## 2020-12-13 ENCOUNTER — Encounter: Payer: Self-pay | Admitting: Neurology

## 2020-12-13 ENCOUNTER — Other Ambulatory Visit: Payer: Self-pay

## 2020-12-13 ENCOUNTER — Ambulatory Visit: Payer: Medicaid Other | Admitting: Neurology

## 2020-12-13 VITALS — BP 101/69 | HR 86 | Ht 62.0 in | Wt 129.4 lb

## 2020-12-13 DIAGNOSIS — G43709 Chronic migraine without aura, not intractable, without status migrainosus: Secondary | ICD-10-CM

## 2020-12-13 MED ORDER — RIZATRIPTAN BENZOATE 10 MG PO TBDP: 10.0000 mg | ORAL_TABLET | ORAL | 11 refills | Status: DC | PRN

## 2020-12-13 MED ORDER — TOPIRAMATE 25 MG PO TABS
25.0000 mg | ORAL_TABLET | Freq: Every day | ORAL | 4 refills | Status: DC
Start: 2020-12-13 — End: 2021-12-20

## 2020-12-13 NOTE — Progress Notes (Signed)
[facility]    Provider:  Dr Lucia Gaskins Requesting Provider: Marcelle Overlie, MD Primary Care Provider:  Marcelle Overlie, MD  CC:  Migraine  Interval history 12/13/2020: Doing well on current regimen. She has the occasional headache, stopped the medication overuse, ibuprofen works a few times a month maybe, reglan didn't help in the past. We discussed trying a triptan and other acute management medications   Interval history March 15, 2019: Patient is here today for follow-up of her migraines, I last appointment she was given Reglan acutely and started on low-dose topiramate.  MRI of the brain was ordered at last appointment however it has not been completed.  The Reglan helps acutely. She is improving. She is not taking ibuprofen as much.  She used 30 tablets in the last 3 month only 10 a month, she just refilled it, will refill for a year.   HPI:  Connie Roach is a 40 y.o. female here as requested by Marcelle Overlie, MD for migraines and tension type headache.  Past medical history depression, migraines. Mother with migraines. Patient has had them for years maybe since college or sooner. They start behind the eyes, +blurry vision,  pulsating/pounding/throbbing, photo/phonophobia, taking ibuprofen daily, occ nausea, daily headaches, no vomiting. >8 migraine days a month sleep helps, last >24 hours and can be moderately severe or severe, sound makes it worse, stress makes it worse, no head trauma, headaches can be positional and she can wake up with headaches, movement or bending over makes it worse and worsens throughout the day especially with exertion.   Reviewed notes, labs and imaging from outside physicians, which showed:   I reviewed emergency room notes.  She complained of headache that started the night prior without any precipitating event or head trauma.  She tried oral sumatriptan without relief.  Photophobia phonophobia.  Similar symptoms in the past.  Frontal  predominant.  Also dizziness, feels like she is moving while standing, worse with sitting movements and position change.  Also popping of ears.  Examination was normal including neurologic examination.  Diagnosed with migraine without aura and vertigo given Reglan, Decadron and Toradol.  I not see any labs since 2011 however, in 2011 she did have anemia and elevated white blood cells, RPR was negative.  I also reviewed referring physician's notes which included no information on migraines except that patient requested an appointment with neurology.   Review of Systems: Patient complains of symptoms per HPI as well as the following symptoms: none . Pertinent negatives and positives per HPI. All others negative    Social History   Socioeconomic History   Marital status: Married    Spouse name: separated, not legally   Number of children: 2   Years of education: Not on file   Highest education level: Bachelor's degree (e.g., BA, AB, BS)  Occupational History   Not on file  Tobacco Use   Smoking status: Every Day    Packs/day: 1.50    Types: Cigarettes   Smokeless tobacco: Never   Tobacco comments:    2 packs per week  Vaping Use   Vaping Use: Never used  Substance and Sexual Activity   Alcohol use: Yes    Alcohol/week: 4.0 standard drinks    Types: 4 Standard drinks or equivalent per week   Drug use: Not Currently    Frequency: 1.0 times per week    Types: Marijuana   Sexual activity: Not on file  Other Topics Concern   Not on  file  Social History Narrative   Lives at home with her daughters   Right handed   Caffeine: 1-2 cups/day   Social Determinants of Health   Financial Resource Strain: Not on file  Food Insecurity: Not on file  Transportation Needs: Not on file  Physical Activity: Not on file  Stress: Not on file  Social Connections: Not on file  Intimate Partner Violence: Not on file    Family History  Problem Relation Age of Onset   Headache Mother     Emphysema Maternal Grandfather    Breast cancer Paternal Grandmother 62   Leukemia Paternal Grandfather    Breast cancer Paternal Aunt 44    Past Medical History:  Diagnosis Date   Anxiety    Depression    Migraine     Patient Active Problem List   Diagnosis Date Noted   Medication overuse headache 12/10/2018   Chronic migraine without aura without status migrainosus, not intractable 12/09/2018   Acute bacterial sinusitis 11/10/2013   Left ear pain 10/30/2010    Past Surgical History:  Procedure Laterality Date   ESSURE TUBAL LIGATION  2011   uterine ablation  2018    Current Outpatient Medications  Medication Sig Dispense Refill   amoxicillin (AMOXIL) 875 MG tablet Take 875 mg by mouth 2 (two) times daily.     buPROPion (WELLBUTRIN XL) 150 MG 24 hr tablet Take 150 mg by mouth daily.     Doxylamine Succinate, Sleep, (SLEEP AID PO) Take by mouth.     ibuprofen (ADVIL) 200 MG tablet Take 400 mg by mouth daily as needed.     Multiple Vitamin (MULTIVITAMIN PO) Take by mouth daily. OTC medication     rizatriptan (MAXALT-MLT) 10 MG disintegrating tablet Take 1 tablet (10 mg total) by mouth as needed for migraine. May repeat in 2 hours if needed 9 tablet 11   sertraline (ZOLOFT) 100 MG tablet Take 150 mg by mouth daily.     topiramate (TOPAMAX) 25 MG tablet Take 1 tablet (25 mg total) by mouth at bedtime. Must keep pending appointment for further refills. 90 tablet 4   No current facility-administered medications for this visit.    Allergies as of 12/13/2020   (No Known Allergies)    Vitals: BP 101/69   Pulse 86   Ht 5\' 2"  (1.575 m)   Wt 129 lb 6.4 oz (58.7 kg)   BMI 23.67 kg/m  Last Weight:  Wt Readings from Last 1 Encounters:  12/13/20 129 lb 6.4 oz (58.7 kg)   Last Height:   Ht Readings from Last 1 Encounters:  12/13/20 5\' 2"  (1.575 m)  Exam: NAD, pleasant                  Speech:    Speech is normal; fluent and spontaneous with normal comprehension.   Cognition:    The patient is oriented to person, place, and time;     recent and remote memory intact;     language fluent;    Cranial Nerves:    The pupils are equal, round, and reactive to light.Trigeminal sensation is intact and the muscles of mastication are normal. The face is symmetric. The palate elevates in the midline. Hearing intact. Voice is normal. Shoulder shrug is normal. The tongue has normal motion without fasciculations.   Coordination:  No dysmetria  Motor Observation:    No asymmetry, no atrophy, and no involuntary movements noted. Tone:    Normal muscle tone.  Strength:    Strength is V/V in the upper and lower limbs.      Sensation: intact to LT     Assessment/Plan:  Chronic daily headaches likely due to Migraines and medication overuse doing great, stpped med overuse, topiramate helping.    - She is doing exceptional on Topamax, stopped overusing OTC meds, Reglan not working acutely, try rizatriptan. She can follow up as needed and if continues to do well she can ask her pcp or obgyn to refill topamax so she doesn't have to pay a specialist appointment but I am always available via email or if she would like to follow up with me anytime.   Migraines: Discussed migraine management including acute management and preventative management. We'll start patient on both. Discussed Topamax side effects especially teratogenicity do not get pregnant and use birth control.  Meds ordered this encounter  Medications   rizatriptan (MAXALT-MLT) 10 MG disintegrating tablet    Sig: Take 1 tablet (10 mg total) by mouth as needed for migraine. May repeat in 2 hours if needed    Dispense:  9 tablet    Refill:  11   topiramate (TOPAMAX) 25 MG tablet    Sig: Take 1 tablet (25 mg total) by mouth at bedtime. Must keep pending appointment for further refills.    Dispense:  90 tablet    Refill:  4    Must keep pending appt for further refills.    Discussed: To prevent or  relieve headaches, try the following: Cool Compress. Lie down and place a cool compress on your head.  Avoid headache triggers. If certain foods or odors seem to have triggered your migraines in the past, avoid them. A headache diary might help you identify triggers.  Include physical activity in your daily routine. Try a daily walk or other moderate aerobic exercise.  Manage stress. Find healthy ways to cope with the stressors, such as delegating tasks on your to-do list.  Practice relaxation techniques. Try deep breathing, yoga, massage and visualization.  Eat regularly. Eating regularly scheduled meals and maintaining a healthy diet might help prevent headaches. Also, drink plenty of fluids.  Follow a regular sleep schedule. Sleep deprivation might contribute to headaches Consider biofeedback. With this mind-body technique, you learn to control certain bodily functions -- such as muscle tension, heart rate and blood pressure -- to prevent headaches or reduce headache pain.    Proceed to emergency room if you experience new or worsening symptoms or symptoms do not resolve, if you have new neurologic symptoms or if headache is severe, or for any concerning symptom.   Cc: Marcelle Overlie, MD,    A total of 20 minutes was spent on this patient's care, reviewing imaging, past records, recent hospitalization notes and results. Over half this time was spent on counseling patient on the  1. Chronic migraine without aura without status migrainosus, not intractable     diagnosis and different diagnostic and therapeutic options, counseling and coordination of care, risks and benefitsof management, compliance, or risk factor reduction and education.     Naomie Dean, MD  [facility] [address] [place], [place] [address]  Phone [phone] Fax [phone]

## 2020-12-13 NOTE — Patient Instructions (Signed)
Continue the topiramate for prevention Acute/Emergent: Rizatriptan(Maxalt): Please take one tablet at the onset of your headache. If it does not improve the symptoms please take one additional tablet. Do not take more then 2 tablets in 24hrs. Do not take use more then 2 to 3 times in a week. Can try ibuprofen first or even take the ibuprofen with the Rizatriptan if needed.   Rizatriptan Tablets What is this medication? RIZATRIPTAN (rye za TRIP tan) treats migraines. It works by blocking pain signals and narrowing blood vessels in the brain. It belongs to a group of medications called triptans. It is not used to prevent migraines. This medicine may be used for other purposes; ask your health care provider or pharmacist if you have questions. COMMON BRAND NAME(S): Maxalt What should I tell my care team before I take this medication? They need to know if you have any of these conditions: Cigarette smoker Circulation problems in fingers and toes Diabetes Heart disease High blood pressure High cholesterol History of irregular heartbeat History of stroke Kidney disease Liver disease Stomach or intestine problems An unusual or allergic reaction to rizatriptan, other medications, foods, dyes, or preservatives Pregnant or trying to get pregnant Breast-feeding How should I use this medication? Take this medication by mouth with a glass of water. Follow the directions on the prescription label. Do not take it more often than directed. Talk to your care team regarding the use of this medication in children. While this medication may be prescribed for children as young as 6 years for selected conditions, precautions do apply. Overdosage: If you think you have taken too much of this medicine contact a poison control center or emergency room at once. NOTE: This medicine is only for you. Do not share this medicine with others. What if I miss a dose? This does not apply. This medication is not for  regular use. What may interact with this medication? Do not take this medication with any of the following: Certain medications for migraine headache like almotriptan, eletriptan, frovatriptan, naratriptan, rizatriptan, sumatriptan, zolmitriptan Ergot alkaloids like dihydroergotamine, ergonovine, ergotamine, methylergonovine MAOIs like Carbex, Eldepryl, Marplan, Nardil, and Parnate This medication may also interact with the following: Certain medications for depression, anxiety, or psychotic disorders Propranolol This list may not describe all possible interactions. Give your health care provider a list of all the medicines, herbs, non-prescription drugs, or dietary supplements you use. Also tell them if you smoke, drink alcohol, or use illegal drugs. Some items may interact with your medicine. What should I watch for while using this medication? Visit your care team for regular checks on your progress. Tell your care team if your symptoms do not start to get better or if they get worse. You may get drowsy or dizzy. Do not drive, use machinery, or do anything that needs mental alertness until you know how this medication affects you. Do not stand up or sit up quickly, especially if you are an older patient. This reduces the risk of dizzy or fainting spells. Alcohol may interfere with the effect of this medication. Your mouth may get dry. Chewing sugarless gum or sucking hard candy and drinking plenty of water may help. Contact your care team if the problem does not go away or is severe. If you take migraine medications for 10 or more days a month, your migraines may get worse. Keep a diary of headache days and medication use. Contact your care team if your migraine attacks occur more frequently. What side effects may  I notice from receiving this medication? Side effects that you should report to your care team as soon as possible: Allergic reactions-skin rash, itching, hives, swelling of the face,  lips, tongue, or throat Burning, pain, tingling, or color changes in the legs or feet Heart attack-pain or tightness in the chest, shoulders, arms, or jaw, nausea, shortness of breath, cold or clammy skin, feeling faint or lightheaded Heart rhythm changes-fast or irregular heartbeat, dizziness, feeling faint or lightheaded, chest pain, trouble breathing Increase in blood pressure Irritability, confusion, fast or irregular heartbeat, muscle stiffness, twitching muscles, sweating, high fever, seizure, chills, vomiting, diarrhea, which may be signs of serotonin syndrome Raynaud's-cool, numb, or painful fingers or toes that may change color from pale, to blue, to red Seizures Stroke-sudden numbness or weakness of the face, arm, or leg, trouble speaking, confusion, trouble walking, loss of balance or coordination, dizziness, severe headache, change in vision Sudden or severe stomach pain, nausea, vomiting, fever, or bloody diarrhea Vision loss Side effects that usually do not require medical attention (report to your care team if they continue or are bothersome): Dizziness General discomfort or fatigue This list may not describe all possible side effects. Call your doctor for medical advice about side effects. You may report side effects to FDA at 1-800-FDA-1088. Where should I keep my medication? Keep out of the reach of children and pets. Store at room temperature between 15 and 30 degrees C (59 and 86 degrees F). Keep container tightly closed. Throw away any unused medication after the expiration date. NOTE: This sheet is a summary. It may not cover all possible information. If you have questions about this medicine, talk to your doctor, pharmacist, or health care provider.  2022 Elsevier/Gold Standard (2020-03-02 12:30:25)

## 2021-04-04 ENCOUNTER — Encounter: Payer: Self-pay | Admitting: Obstetrics and Gynecology

## 2021-04-23 DIAGNOSIS — H6993 Unspecified Eustachian tube disorder, bilateral: Secondary | ICD-10-CM | POA: Insufficient documentation

## 2021-08-23 IMAGING — MG DIGITAL DIAGNOSTIC BILAT W/ TOMO W/ CAD
8 series · 8 of 24 positions shown · non-contrast
Comparison: Previous exams.

CLINICAL DATA: Short-term follow-up for probably benign left breast
mass.

EXAM:
DIGITAL DIAGNOSTIC BILATERAL MAMMOGRAM WITH TOMOSYNTHESIS AND CAD;
ULTRASOUND LEFT BREAST LIMITED
TECHNIQUE: Bilateral digital diagnostic mammography and breast tomosynthesis
was performed. The images were evaluated with computer-aided
detection.; Targeted ultrasound examination of the left breast was
performed

[L MLO synth-2D]
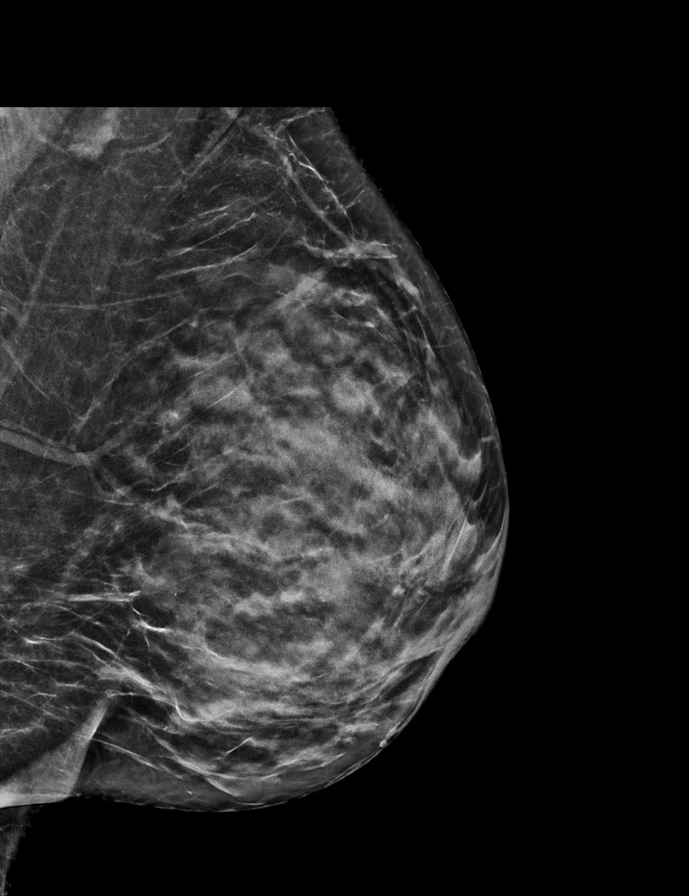

[R MLO synth-2D]
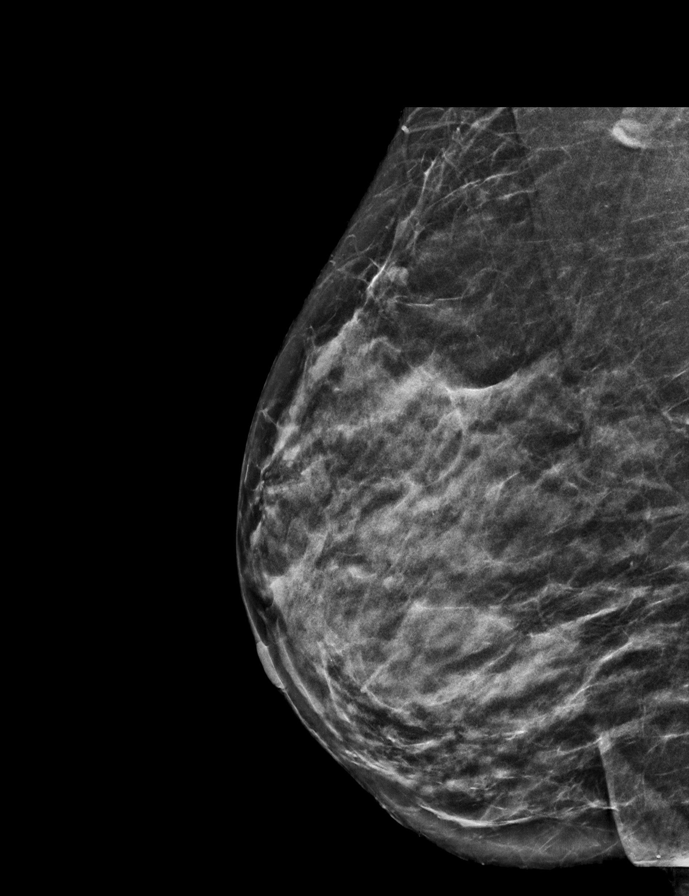

[R CC synth-2D]
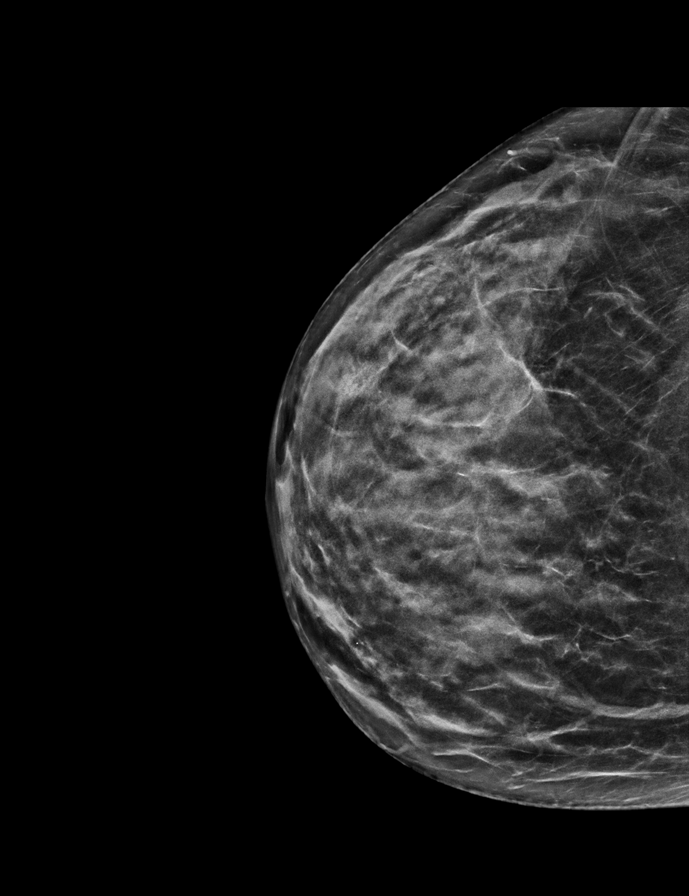

[L CC synth-2D]
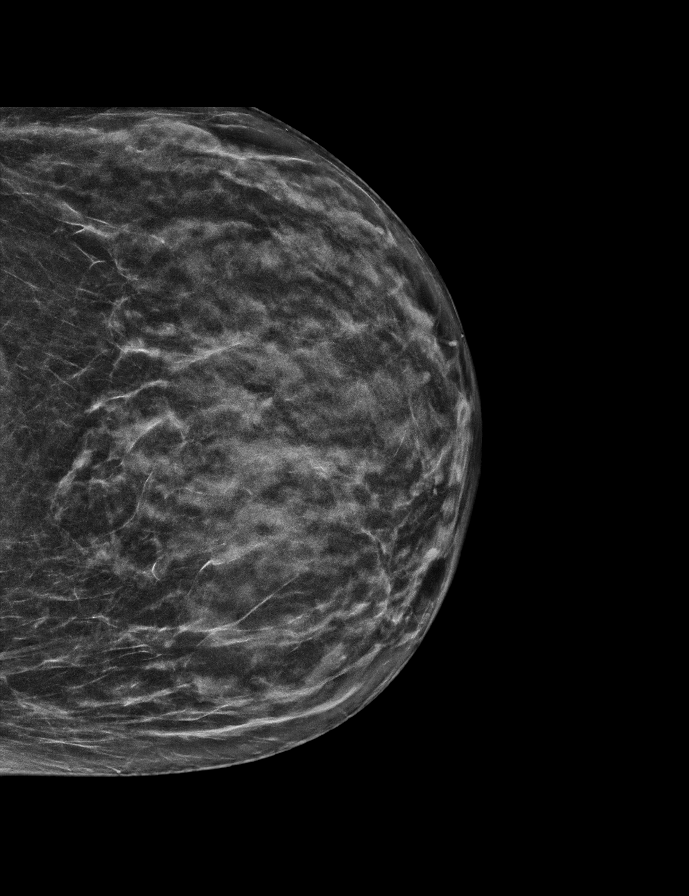

[L MLO tomo · tomo slice 29/58.0]
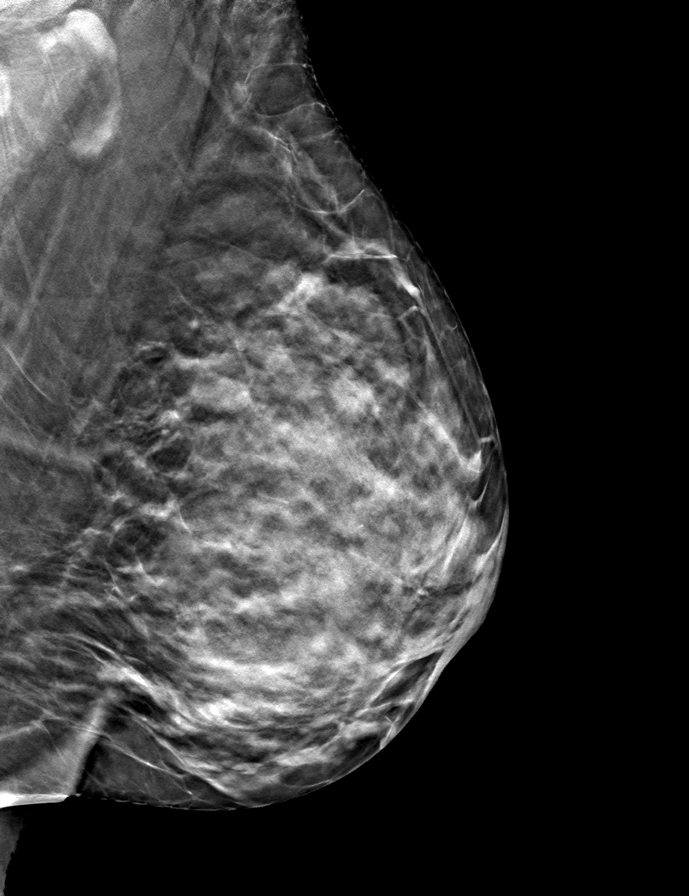

[L CC tomo · tomo slice 26/51.0]
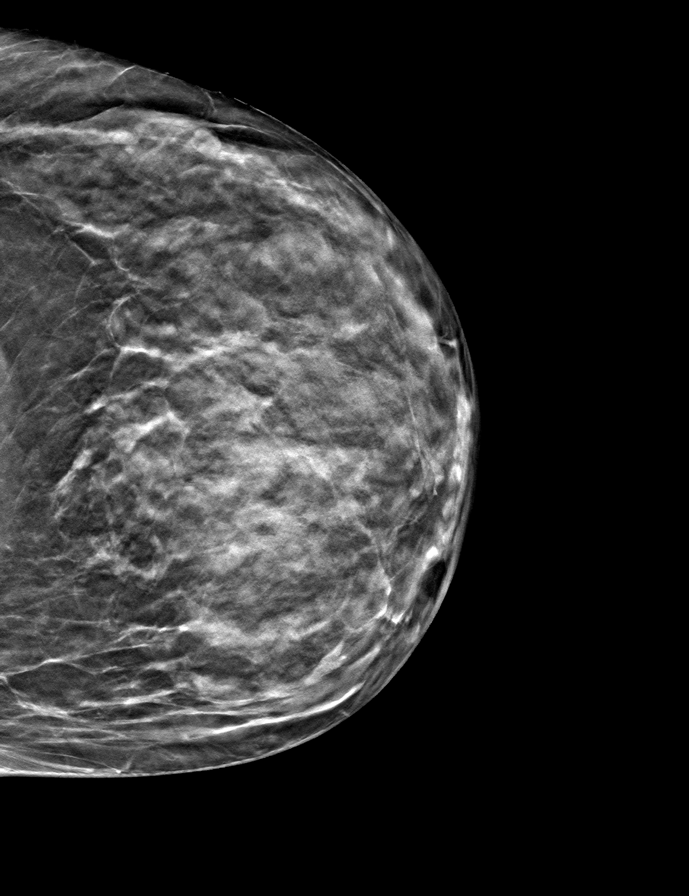

[R MLO tomo · tomo slice 25/50.0]
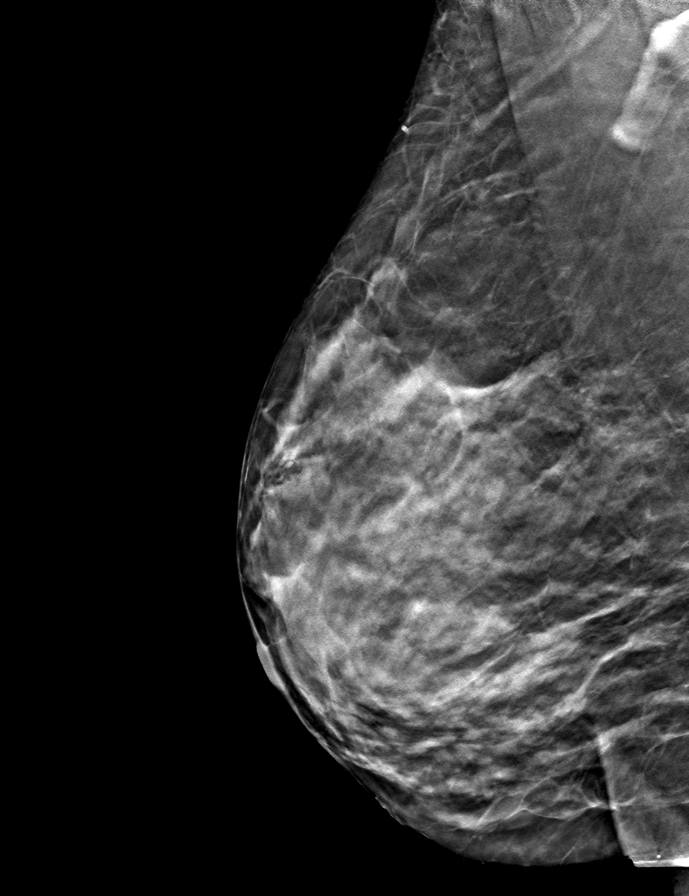

[R CC tomo · tomo slice 27/53.0]
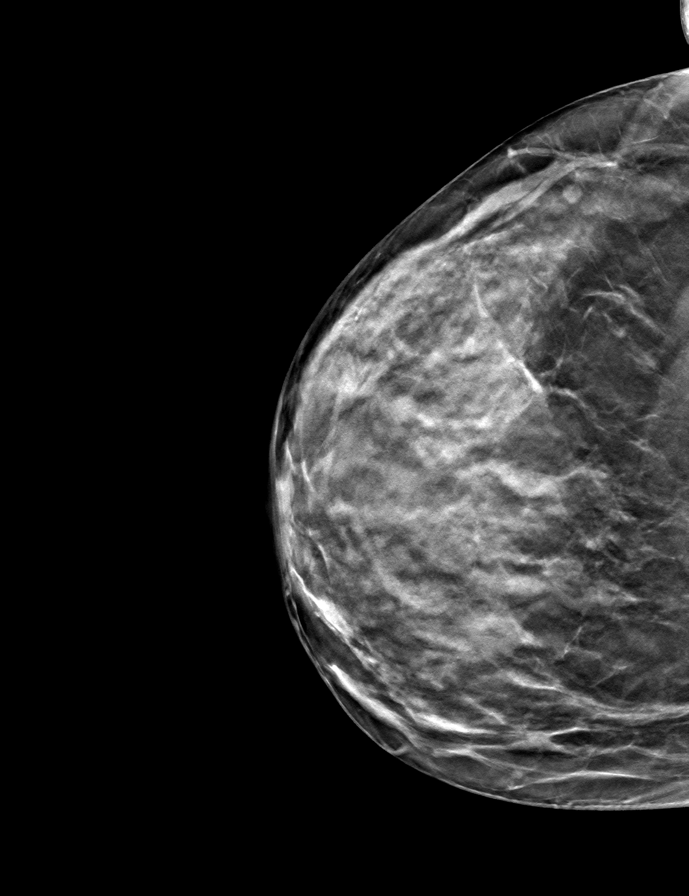

[8 of 24 positions shown; findings below may reference images not displayed]

ACR Breast Density Category d: The breast tissue is extremely dense,
which lowers the sensitivity of mammography.
FINDINGS: No suspicious masses or calcifications are seen in either breast.
There is no mammographic evidence of malignancy in either breast.

Targeted ultrasound of the left breast was performed. The tiny
cluster of cysts in the left breast at 4 o'clock 2 cm from nipple
measures 0.3 x 0.2 x 0.3 cm. This is overall unchanged in size in
appearance dating back to prior ultrasounds from [address]71 and considered
benign given greater than 2 years of stability.
IMPRESSION: No findings of malignancy in either breast.

RECOMMENDATION:
Screening mammogram at age 40 unless there are persistent or
intervening clinical concerns. (Code:B9-3-NRG)

I have discussed the findings and recommendations with the patient.
If applicable, a reminder letter will be sent to the patient
regarding the next appointment.

BI-RADS CATEGORY  2: Benign.

## 2021-10-29 ENCOUNTER — Other Ambulatory Visit: Payer: Self-pay | Admitting: Obstetrics and Gynecology

## 2021-10-29 DIAGNOSIS — Z1231 Encounter for screening mammogram for malignant neoplasm of breast: Secondary | ICD-10-CM

## 2021-11-26 ENCOUNTER — Ambulatory Visit
Admission: RE | Admit: 2021-11-26 | Discharge: 2021-11-26 | Disposition: A | Discharge status: Home / Self Care | Payer: Medicaid Other | Source: Ambulatory Visit | Attending: Obstetrics and Gynecology | Admitting: Obstetrics and Gynecology

## 2021-11-26 DIAGNOSIS — Z1231 Encounter for screening mammogram for malignant neoplasm of breast: Secondary | ICD-10-CM

## 2021-12-18 ENCOUNTER — Telehealth: Payer: Medicaid Other | Admitting: Family Medicine

## 2021-12-20 NOTE — Patient Instructions (Signed)
Below is our plan:  We will continue topiramate and rizatriptan as prescribed.   Please make sure you are staying well hydrated. I recommend 50-60 ounces daily. Well balanced diet and regular exercise encouraged. Consistent sleep schedule with 6-8 hours recommended.   Please continue follow up with care team as directed.   Follow up with me in 1 year   You may receive a survey regarding today's visit. I encourage you to leave honest feed back as I do use this information to improve patient care. Thank you for seeing me today!

## 2021-12-20 NOTE — Progress Notes (Signed)
PATIENT: Connie Roach DOB: 1980-09-13  REASON FOR VISIT: follow up HISTORY FROM: patient  Virtual Visit via Telephone Note  I connected with ANIEYA HELMAN on 12/28/21 at  9:30 AM EST by telephone and verified that I am speaking with the correct person using two identifiers.   I discussed the limitations, risks, security and privacy concerns of performing an evaluation and management service by telephone and the availability of in person appointments. I also discussed with the patient that there may be a patient responsible charge related to this service. The patient expressed understanding and agreed to proceed.   History of Present Illness:  12/28/21 ALL (Mychart): Connie Roach is a 41 y.o. female here today for follow up for migraines. She continues topiramate 25mg  daily and rizatriptan as needed. She is doing well. She may have 2-3 migraine days per month. Rizatriptan works well for abortive therapy. She may have a few milder headaches per month relieved by ibuprofen but feels she is doing much better. 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   History (copied from Dr Cathren Laine previous note)  Interval history 12/13/2020: Doing well on current regimen. She has the occasional headache, stopped the medication overuse, ibuprofen works a few times a month maybe, reglan didn't help in the past. We discussed trying a triptan and other acute management medications   Interval history March 15, 2019: Patient is here today for follow-up of her migraines, I last appointment she was given Reglan acutely and started on low-dose topiramate.  MRI of the brain was ordered at last appointment however it has not been completed. The Reglan helps acutely. She is improving. She is not taking ibuprofen as much.  She used 30 tablets in the last 3 month only 10 a month, she just refilled it, will refill for a year.    HPI:  Connie Roach is a 41 y.o. female  here as requested by Dian Queen, MD for migraines and tension type headache.  Past medical history depression, migraines. Mother with migraines. Patient has had them for years maybe since college or sooner. They start behind the eyes, +blurry vision,  pulsating/pounding/throbbing, photo/phonophobia, taking ibuprofen daily, occ nausea, daily headaches, no vomiting. >8 migraine days a month sleep helps, last >24 hours and can be moderately severe or severe, sound makes it worse, stress makes it worse, no head trauma, headaches can be positional and she can wake up with headaches, movement or bending over makes it worse and worsens throughout the day especially with exertion.    Reviewed notes, labs and imaging from outside physicians, which showed:    I reviewed emergency room notes.  She complained of headache that started the night prior without any precipitating event or head trauma.  She tried oral sumatriptan without relief.  Photophobia phonophobia.  Similar symptoms in the past.  Frontal predominant.  Also dizziness, feels like she is moving while standing, worse with sitting movements and position change.  Also popping of ears.  Examination was normal including neurologic examination.  Diagnosed with migraine without aura and vertigo given Reglan, Decadron and Toradol.  I not see any labs since 2011 however, in 2011 she did have anemia and elevated white blood cells, RPR was negative.  I also reviewed referring physician's notes which included no information on migraines except that patient requested an appointment with neurology.   Observations/Objective:  Generalized: Well developed, in no acute distress  Mentation:  Alert oriented to time, place, history taking. Follows all commands speech and language fluent   Assessment and Plan:  41 y.o. year old female  has a past medical history of Anxiety, Depression, and Migraine. here with    ICD-10-CM   1. Chronic migraine without aura  without status migrainosus, not intractable  G43.709      Connie Roach is doing well. We will continue topiramate 25mg  daily and rizatriptan as needed. Healthy lifestyle habits encouraged. She will follow up in 1 year, sooner if needed.   No orders of the defined types were placed in this encounter.   Meds ordered this encounter  Medications   topiramate (TOPAMAX) 25 MG tablet    Sig: Take 1 tablet (25 mg total) by mouth at bedtime. Must keep pending appointment for further refills.    Dispense:  90 tablet    Refill:  4    Order Specific Question:   Supervising Provider    Answer:   Melvenia Beam [id number]   rizatriptan (MAXALT-MLT) 10 MG disintegrating tablet    Sig: Take 1 tablet (10 mg total) by mouth as needed for migraine. May repeat in 2 hours if needed    Dispense:  9 tablet    Refill:  11    Order Specific Question:   Supervising Provider    Answer:   Melvenia Beam [id number]     Follow Up Instructions:  I discussed the assessment and treatment plan with the patient. The patient was provided an opportunity to ask questions and all were answered. The patient agreed with the plan and demonstrated an understanding of the instructions.   The patient was advised to call back or seek an in-person evaluation if the symptoms worsen or if the condition fails to improve as anticipated.  I provided 15 minutes of non-face-to-face time during this encounter. Patient located at their place of residence during [place] visit. Provider is in the office.    Debbora Presto, NP

## 2021-12-28 ENCOUNTER — Telehealth: Payer: Medicaid Other | Admitting: Family Medicine

## 2021-12-28 ENCOUNTER — Encounter: Payer: Self-pay | Admitting: Family Medicine

## 2021-12-28 DIAGNOSIS — G43709 Chronic migraine without aura, not intractable, without status migrainosus: Secondary | ICD-10-CM

## 2021-12-28 MED ORDER — RIZATRIPTAN BENZOATE 10 MG PO TBDP: 10.0000 mg | ORAL_TABLET | ORAL | 11 refills | Status: AC | PRN

## 2021-12-28 MED ORDER — TOPIRAMATE 25 MG PO TABS: 25.0000 mg | ORAL_TABLET | Freq: Every day | ORAL | 4 refills | Status: DC

## 2022-04-24 DIAGNOSIS — H6692 Otitis media, unspecified, left ear: Secondary | ICD-10-CM | POA: Insufficient documentation

## 2022-05-01 ENCOUNTER — Ambulatory Visit
Admission: RE | Admit: 2022-05-01 | Discharge: 2022-05-01 | Disposition: A | Discharge status: Home / Self Care | Payer: Medicaid Other | Source: Ambulatory Visit | Attending: Nurse Practitioner | Admitting: Nurse Practitioner

## 2022-05-01 VITALS — BP 123/89 | HR 72 | Temp 99.6°F | Resp 18

## 2022-05-01 DIAGNOSIS — H6993 Unspecified Eustachian tube disorder, bilateral: Secondary | ICD-10-CM

## 2022-05-01 MED ORDER — PREDNISONE 20 MG PO TABS: 40.0000 mg | ORAL_TABLET | Freq: Every day | ORAL | 0 refills | Status: AC

## 2022-05-01 NOTE — Discharge Instructions (Signed)
Continue Flonase and start prednisone daily for 5 days Follow-up with your PCP if symptoms or not improving Please go to the emergency room if you have any worsening symptoms

## 2022-05-01 NOTE — ED Triage Notes (Signed)
Pt present with c/o chills, sweats. States she was recently positive with COVID.   States she has ear pain x 2 weeks. Pt states she had an ear infection and was put on antibiotics. The ear pain and congestion has not resolved.

## 2022-05-01 NOTE — ED Provider Notes (Signed)
[facility]    CSN: [id number] Arrival date & time: 05/01/22  1447      History   Chief Complaint Chief Complaint  Patient presents with   Ear Pain   Nasal Congestion    HPI Connie Roach is a 42 y.o. female presents for evaluation of ear pain.  Patient reports 2 weeks of right ear pain/popping sensation.  Reports she was COVID-positive 2 and half weeks ago and still has some lingering congestion and cough.  She states last week she went to her doctor and was placed on amoxicillin for ear infection.  She completed this as prescribed and reports no improvement in her ear concern.  She has been using decongestants, Flonase, and over-the-counter medications.  Chart review does show history of eustachian tube dysfunction where she has seen otolaryngology.  No other concerns at this time  HPI  Past Medical History:  Diagnosis Date   Anxiety    Depression    Migraine     Patient Active Problem List   Diagnosis Date Noted   Medication overuse headache 12/10/2018   Chronic migraine without aura without status migrainosus, not intractable 12/09/2018   Acute bacterial sinusitis 11/10/2013   Left ear pain 10/30/2010    Past Surgical History:  Procedure Laterality Date   ESSURE TUBAL LIGATION  2011   uterine ablation  2018    OB History   No obstetric history on file.      Home Medications    Prior to Admission medications   Medication Sig Start Date End Date Taking? Authorizing Provider  predniSONE (DELTASONE) 20 MG tablet Take 2 tablets (40 mg total) by mouth daily with breakfast for 5 days. 05/01/22 05/06/22 Yes Melynda Ripple, NP  amoxicillin (AMOXIL) 875 MG tablet Take 875 mg by mouth 2 (two) times daily. 12/08/20   [provider]  buPROPion (WELLBUTRIN XL) 150 MG 24 hr tablet Take 150 mg by mouth daily. 11/04/20   [provider]  Doxylamine Succinate, Sleep, (SLEEP AID PO) Take by mouth.    [provider]  ibuprofen (ADVIL) 200  MG tablet Take 400 mg by mouth daily as needed.    [provider]  Multiple Vitamin (MULTIVITAMIN PO) Take by mouth daily. OTC medication    [provider]  rizatriptan (MAXALT-MLT) 10 MG disintegrating tablet Take 1 tablet (10 mg total) by mouth as needed for migraine. May repeat in 2 hours if needed 12/28/21   Lomax, Amy, NP  sertraline (ZOLOFT) 100 MG tablet Take 150 mg by mouth daily. 10/29/18   [provider]  topiramate (TOPAMAX) 25 MG tablet Take 1 tablet (25 mg total) by mouth at bedtime. Must keep pending appointment for further refills. 12/28/21   Debbora Presto, NP    Family History Family History  Problem Relation Age of Onset   Headache Mother    Emphysema Maternal Grandfather    Breast cancer Paternal Grandmother 58   Leukemia Paternal Grandfather    Breast cancer Paternal Aunt 2    Social History Social History   Tobacco Use   Smoking status: Every Day    Packs/day: 1.50    Types: Cigarettes   Smokeless tobacco: Never   Tobacco comments:    2 packs per week  Vaping Use   Vaping Use: Never used  Substance Use Topics   Alcohol use: Yes    Alcohol/week: 4.0 standard drinks of alcohol    Types: 4 Standard drinks or equivalent per week  Drug use: Not Currently    Frequency: 1.0 times per week    Types: Marijuana     Allergies   Patient has no known allergies.   Review of Systems Review of Systems  HENT:  Positive for ear pain.      Physical Exam Triage Vital Signs ED Triage Vitals  Enc Vitals Group     BP 05/01/22 1510 123/89     Pulse Rate 05/01/22 1508 72     Resp 05/01/22 1508 18     Temp 05/01/22 1508 99.6 F (37.6 C)     Temp Source 05/01/22 1508 Oral     SpO2 05/01/22 1508 98 %     Weight --      Height --      Head Circumference --      Peak Flow --      Pain Score 05/01/22 1507 0     Pain Loc --      Pain Edu? --      Excl. in [place]? --    No data found.  Updated Vital Signs BP 123/89   Pulse 72   Temp  99.6 F (37.6 C) (Oral)   Resp 18   SpO2 98%   Visual Acuity Right Eye Distance:   Left Eye Distance:   Bilateral Distance:    Right Eye Near:   Left Eye Near:    Bilateral Near:     Physical Exam Vitals and nursing note reviewed.  Constitutional:      General: She is not in acute distress.    Appearance: She is well-developed. She is not ill-appearing.  HENT:     Head: Normocephalic and atraumatic.     Right Ear: Ear canal normal. A middle ear effusion is present. Tympanic membrane is not erythematous.     Left Ear: Ear canal normal. A middle ear effusion is present. Tympanic membrane is not erythematous.     Nose: Congestion present.     Mouth/Throat:     Mouth: Mucous membranes are moist.     Pharynx: Oropharynx is clear. Uvula midline. No posterior oropharyngeal erythema.     Tonsils: No tonsillar exudate or tonsillar abscesses.  Eyes:     Conjunctiva/sclera: Conjunctivae normal.     Pupils: Pupils are equal, round, and reactive to light.  Cardiovascular:     Rate and Rhythm: Normal rate and regular rhythm.     Heart sounds: Normal heart sounds.  Pulmonary:     Effort: Pulmonary effort is normal.     Breath sounds: Normal breath sounds.  Musculoskeletal:     Cervical back: Normal range of motion and neck supple.  Lymphadenopathy:     Cervical: No cervical adenopathy.  Skin:    General: Skin is warm and dry.  Neurological:     General: No focal deficit present.     Mental Status: She is alert and oriented to person, place, and time.  Psychiatric:        Mood and Affect: Mood normal.        Behavior: Behavior normal.      UC Treatments / Results  Labs (all labs ordered are listed, but only abnormal results are displayed) Labs Reviewed - No data to display  EKG   Radiology No results found.  Procedures Procedures (including critical care time)  Medications Ordered in UC Medications - No data to display  Initial Impression / Assessment and Plan /  UC Course  I have reviewed the triage vital  signs and the nursing notes.  Pertinent labs & imaging results that were available during my care of the patient were reviewed by me and considered in my medical decision making (see chart for details).     Reviewed exam and symptoms with patient. No sign of otitis media on exam.  Patient does have low-grade fever in clinic.  Exam is otherwise unremarkable.  She was recently on antibiotics so doubt subacute bacterial infection.  States cough is better.   Discussed eustachian tube dysfunction Patient will continue Flonase and as she is already been on over-the-counter decongestants will start prednisone x 5 days Advised PCP follow-up or otolaryngology follow-up if symptoms do not improve ER precautions reviewed and patient verbalized understanding Final Clinical Impressions(s) / UC Diagnoses   Final diagnoses:  Eustachian tube dysfunction, bilateral     Discharge Instructions      Continue Flonase and start prednisone daily for 5 days Follow-up with your PCP if symptoms or not improving Please go to the emergency room if you have any worsening symptoms   ED Prescriptions     Medication Sig Dispense Auth. Provider   predniSONE (DELTASONE) 20 MG tablet Take 2 tablets (40 mg total) by mouth daily with breakfast for 5 days. 10 tablet Melynda Ripple, NP      PDMP not reviewed this encounter.   Melynda Ripple, NP 05/01/22 1525

## 2022-10-28 ENCOUNTER — Other Ambulatory Visit: Payer: Self-pay | Admitting: Obstetrics and Gynecology

## 2022-10-28 DIAGNOSIS — Z1231 Encounter for screening mammogram for malignant neoplasm of breast: Secondary | ICD-10-CM

## 2022-11-14 ENCOUNTER — Ambulatory Visit: Payer: Medicaid Other | Admitting: Dermatology

## 2022-11-14 ENCOUNTER — Encounter: Payer: Self-pay | Admitting: Dermatology

## 2022-11-14 VITALS — BP 124/82 | HR 80

## 2022-11-14 DIAGNOSIS — G43909 Migraine, unspecified, not intractable, without status migrainosus: Secondary | ICD-10-CM | POA: Insufficient documentation

## 2022-11-14 DIAGNOSIS — B079 Viral wart, unspecified: Secondary | ICD-10-CM

## 2022-11-14 NOTE — Patient Instructions (Addendum)
Cryotherapy Aftercare  Wash gently with soap and water everyday.   Apply Vaseline and Band-Aid daily until healed.   Important Information   Due to recent changes in healthcare laws, you may see results of your pathology and/or laboratory studies on MyChart before the doctors have had a chance to review them. We understand that in some cases there may be results that are confusing or concerning to you. Please understand that not all results are received at the same time and often the doctors may need to interpret multiple results in order to provide you with the best plan of care or course of treatment. Therefore, we ask that you please give Connie Roach 2 business days to thoroughly review all your results before contacting the office for clarification. Should we see a critical lab result, you will be contacted sooner.     If You Need Anything After Your Visit   If you have any questions or concerns for your doctor, please call our main line at [phone]. If no one answers, please leave a voicemail as directed and we will return your call as soon as possible. Messages left after 4 pm will be answered the following business day.    You may also send Connie Roach a message via MyChart. We typically respond to MyChart messages within 1-2 business days.  For prescription refills, please ask your pharmacy to contact our office. Our fax number is [phone].  If you have an urgent issue when the clinic is closed that cannot wait until the next business day, you can page your doctor at the number below.     Please note that while we do our best to be available for urgent issues outside of office hours, we are not available 24/7.    If you have an urgent issue and are unable to reach Connie Roach, you may choose to seek medical care at your doctor's office, retail clinic, urgent care center, or emergency room.   If you have a medical emergency, please immediately call 911 or go to the emergency department. In the event  of inclement weather, please call our main line at [phone] for an update on the status of any delays or closures.  Dermatology Medication Tips: Please keep the boxes that topical medications come in in order to help keep track of the instructions about where and how to use these. Pharmacies typically print the medication instructions only on the boxes and not directly on the medication tubes.   If your medication is too expensive, please contact our office at [phone] or send Connie Roach a message through MyChart.    We are unable to tell what your co-pay for medications will be in advance as this is different depending on your insurance coverage. However, we may be able to find a substitute medication at lower cost or fill out paperwork to get insurance to cover a needed medication.    If a prior authorization is required to get your medication covered by your insurance company, please allow Connie Roach 1-2 business days to complete this process.   Drug prices often vary depending on where the prescription is filled and some pharmacies may offer cheaper prices.   The website www.goodrx.com contains coupons for medications through different pharmacies. The prices here do not account for what the cost may be with help from insurance (it may be cheaper with your insurance), but the website can give you the price if you did not use any insurance.  - You can print the  associated coupon and take it with your prescription to the pharmacy.  - You may also stop by our office during regular business hours and pick up a GoodRx coupon card.  - If you need your prescription sent electronically to a different pharmacy, notify our office through [facility] or by phone at [phone]

## 2022-11-14 NOTE — Progress Notes (Signed)
   New Patient Visit   Subjective  Connie Roach is a 42 y.o. female who presents for the following: Skin lesion  Patient states she has growths located at the upper legs that she would like to have examined. Patient reports the areas have been there for several years. She reports the areas are not bothersome. Patient rates irritation 0 out of 10. She states that the areas have spread. Patient reports she has previously been treated for these areas [facility] treated with Cryo & she has treated with Compound W). Patient reports Hx of bx.  The patient has spots, moles and lesions to be evaluated, some may be new or changing and the patient may have concern these could be cancer.   The following portions of the chart were reviewed this encounter and updated as appropriate: medications, allergies, medical history  Review of Systems:  No other skin or systemic complaints except as noted in HPI or Assessment and Plan.  Objective  Well appearing patient in no apparent distress; mood and affect are within normal limits.  A focused examination was performed of the following areas: B/L Legs  Relevant exam findings are noted in the Assessment and Plan.  Left Knee - Anterior, Left Lower Leg - Anterior (5), Left Lower Leg - Posterior (5), Left Popliteal Fossa (2), Left Thigh - Anterior (2), Right Foot - Anterior (2), Right Forearm - Posterior (2), Right Knee - Anterior, Right Lower Leg - Anterior (3), Right Lower Leg - Posterior, Right Popliteal Fossa (2), Right Thigh - Anterior (2) Verrucous papules     Assessment & Plan   WART Exam: verrucous papule(s)  Counseling Discussed viral / HPV (Human Papilloma Virus) etiology and risk of spread /infectivity to other areas of body as well as to other people.  Multiple treatments and methods may be required to clear warts and it is possible treatment may not be successful.  Treatment risks include discoloration; scarring and there is still  potential for wart recurrence.  Treatment Plan: - Recommended taking oral Cemetidine daily as a preventative measure -Cryotherapy with LN2   Viral warts, unspecified type (28) Left Knee - Anterior; Right Knee - Anterior; Left Popliteal Fossa (2); Right Popliteal Fossa (2); Right Foot - Anterior (2); Right Forearm - Posterior (2); Left Thigh - Anterior (2); Right Thigh - Anterior (2); Left Lower Leg - Anterior (5); Right Lower Leg - Anterior (3); Left Lower Leg - Posterior (5); Right Lower Leg - Posterior  Destruction of lesion - Left Knee - Anterior, Left Lower Leg - Anterior (5), Left Thigh - Anterior (2), Right Foot - Anterior (2), Right Knee - Anterior, Right Lower Leg - Anterior (3), Right Popliteal Fossa, Right Thigh - Anterior (2) Complexity: simple   Destruction method: cryotherapy   Informed consent: discussed and consent obtained   Timeout:  patient name, date of birth, surgical site, and procedure verified Lesion destroyed using liquid nitrogen: Yes   Cryotherapy cycles:  35 Outcome: patient tolerated procedure well with no complications   Post-procedure details: wound care instructions given      Return in about 4 weeks (around 12/12/2022) for Wart F/U.    Documentation: I have reviewed the above documentation for accuracy and completeness, and I agree with the above.  Stasia Cavalier, am acting as scribe for Langston Reusing, DO.   Langston Reusing, DO

## 2022-11-29 ENCOUNTER — Ambulatory Visit: Payer: Medicaid Other

## 2022-12-11 ENCOUNTER — Ambulatory Visit
Admission: RE | Admit: 2022-12-11 | Discharge: 2022-12-11 | Disposition: A | Discharge status: Home / Self Care | Payer: Medicaid Other | Source: Ambulatory Visit | Attending: Obstetrics and Gynecology | Admitting: Obstetrics and Gynecology

## 2022-12-11 DIAGNOSIS — Z1231 Encounter for screening mammogram for malignant neoplasm of breast: Secondary | ICD-10-CM

## 2022-12-17 ENCOUNTER — Ambulatory Visit: Payer: Medicaid Other | Admitting: Dermatology

## 2022-12-17 DIAGNOSIS — B079 Viral wart, unspecified: Secondary | ICD-10-CM

## 2022-12-17 NOTE — Patient Instructions (Addendum)

## 2022-12-17 NOTE — Progress Notes (Signed)
   Follow-Up Visit   Subjective  Connie Roach is a 42 y.o. female who presents for the following: warts  Patient present today for follow up visit for follow up. Patient was last evaluated on 11/14/22 for warts that were spreading on lower extremities. Pt was treated during OV with cryotherapy (35 cycles) and was recommended taking Cemetidine OTC daily . Patient reports sxs are better. Patient denies medication changes.  The following portions of the chart were reviewed this encounter and updated as appropriate: medications, allergies, medical history  Review of Systems:  No other skin or systemic complaints except as noted in HPI or Assessment and Plan.  Objective  Well appearing patient in no apparent distress; mood and affect are within normal limits.   A focused examination was performed of the following areas: lower legs   Relevant exam findings are noted in the Assessment and Plan.  11/14/22 Viral warts, unspecified type (28) Left Knee - Anterior; Right Knee - Anterior; Left Popliteal Fossa (2); Right Popliteal Fossa (2); Right Foot - Anterior (2); Right Forearm - Posterior (2); Left Thigh - Anterior (2); Right Thigh - Anterior (2); Left Lower Leg - Anterior (5); Right Lower Leg - Anterior (3); Left Lower Leg - Posterior (5); Right Lower Leg - Posterior   Destruction of lesion - Left Knee - Anterior, Left Lower Leg - Anterior (5), Left Thigh - Anterior (2), Right Foot - Anterior (2), Right Knee - Anterior, Right Lower Leg - Anterior (3), Right Popliteal Fossa, Right Thigh - Anterior (2) Complexity: simple   Destruction method: cryotherapy   Informed consent: discussed and consent obtained   Timeout:  patient name, date of birth, surgical site, and procedure verified Lesion destroyed using liquid nitrogen: Yes   Cryotherapy cycles:  35 Outcome: patient tolerated procedure well with no complications   Post-procedure details: wound care instructions given      Assessment & Plan      Viral warts, unspecified type (16) Lower bilateral legs, bilateral arms  Treatment: - Apply Aquaphor to areas frozen today  -Continue daily cimetidine - 6 week recheck   Return in about 6 weeks (around 01/28/2023).    Documentation: I have reviewed the above documentation for accuracy and completeness, and I agree with the above.   I, Shirron Marcha Solders, CMA, am acting as scribe for [employer], DO.   Langston Reusing, DO

## 2023-01-29 ENCOUNTER — Ambulatory Visit: Payer: Medicaid Other | Admitting: Dermatology

## 2023-01-31 ENCOUNTER — Other Ambulatory Visit: Payer: Self-pay

## 2023-01-31 ENCOUNTER — Emergency Department
Admission: EM | Admit: 2023-01-31 | Discharge: 2023-01-31 | Disposition: A | Discharge status: Home / Self Care | Payer: Medicaid Other | Attending: Emergency Medicine | Admitting: Emergency Medicine

## 2023-01-31 ENCOUNTER — Emergency Department: Payer: Medicaid Other

## 2023-01-31 DIAGNOSIS — S161XXA Strain of muscle, fascia and tendon at neck level, initial encounter: Secondary | ICD-10-CM | POA: Insufficient documentation

## 2023-01-31 DIAGNOSIS — Y92410 Unspecified street and highway as the place of occurrence of the external cause: Secondary | ICD-10-CM | POA: Diagnosis not present

## 2023-01-31 DIAGNOSIS — M545 Low back pain, unspecified: Secondary | ICD-10-CM | POA: Insufficient documentation

## 2023-01-31 DIAGNOSIS — S199XXA Unspecified injury of neck, initial encounter: Secondary | ICD-10-CM | POA: Diagnosis present

## 2023-01-31 MED ORDER — MELOXICAM 15 MG PO TABS: 15.0000 mg | ORAL_TABLET | Freq: Every day | ORAL | 0 refills | Status: AC
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
## 2023-01-31 NOTE — ED Notes (Signed)
Provider at bedside to evaluate patient

## 2023-04-10 ENCOUNTER — Ambulatory Visit: Payer: Medicaid Other | Admitting: Dermatology

## 2023-05-07 NOTE — Progress Notes (Unsigned)
   Office Visit Note   Patient: Connie Roach           Date of Birth: 1981/01/20           MRN: [id number] Visit Date: 05/08/2023              Requested by:  Marcelle Overlie, MD [address] [place],  [place] [address]  PCP: Marcelle Overlie, MD   Assessment & Plan: Visit Diagnoses: No diagnosis found.  Plan: ***  Follow-Up Instructions: No follow-ups on file.   Orders:  No orders of the defined types were placed in this encounter. No orders of the defined types were placed in this encounter.    Procedures: No procedures performed   Clinical Data: No additional findings.   Subjective: No chief complaint on file.  HPI  Review of Systems   Objective: Vital Signs: There were no vitals taken for this visit.  Physical Exam  Ortho Exam  Specialty Comments:  No specialty comments available.  Imaging: No results found.   PMFS History: Patient Active Problem List   Diagnosis Date Noted  . Migraine 11/14/2022  . Eustachian tube dysfunction, bilateral 04/23/2021  . Low grade squamous intraepithelial lesion (LGSIL) on cervicovaginal cytologic smear 11/02/2019  . Medication overuse headache 12/10/2018  . Chronic migraine without aura without status migrainosus, not intractable 12/09/2018  . Acute bacterial sinusitis 11/10/2013  . Left ear pain 10/30/2010   Past Medical History:  Diagnosis Date  . Anxiety   . Depression   . Migraine     Family History  Problem Relation Age of Onset  . Headache Mother   . Emphysema Maternal Grandfather   . Breast cancer Paternal Grandmother 76  . Leukemia Paternal Grandfather   . Breast cancer Paternal Aunt 2    Past Surgical History:  Procedure Laterality Date  . ESSURE TUBAL LIGATION  2011  . uterine ablation  2018   Social History   Occupational History  . Not on file  Tobacco Use  . Smoking status: Every Day    Current packs/day: 1.50    Types: Cigarettes    Passive exposure: Never   . Smokeless tobacco: Never  . Tobacco comments:    2 packs per week  Vaping Use  . Vaping status: Never Used  Substance and Sexual Activity  . Alcohol use: Yes    Alcohol/week: 4.0 standard drinks of alcohol    Types: 4 Standard drinks or equivalent per week  . Drug use: Not Currently    Frequency: 1.0 times per week    Types: Marijuana  . Sexual activity: Not on file

## 2023-05-08 ENCOUNTER — Ambulatory Visit: Admitting: Orthopaedic Surgery

## 2023-05-08 ENCOUNTER — Encounter: Payer: Self-pay | Admitting: Orthopaedic Surgery

## 2023-05-08 DIAGNOSIS — M545 Low back pain, unspecified: Secondary | ICD-10-CM

## 2023-05-08 DIAGNOSIS — G8929 Other chronic pain: Secondary | ICD-10-CM | POA: Diagnosis not present

## 2023-05-08 MED ORDER — PREDNISONE 10 MG (21) PO TBPK: ORAL_TABLET | ORAL | 3 refills | Status: DC

## 2023-05-09 ENCOUNTER — Other Ambulatory Visit: Payer: Self-pay

## 2023-05-09 ENCOUNTER — Ambulatory Visit: Payer: Self-pay | Admitting: Allergy

## 2023-05-09 ENCOUNTER — Encounter: Payer: Self-pay | Admitting: Allergy

## 2023-05-09 VITALS — BP 128/78 | HR 106 | Temp 98.0°F | Resp 18 | Ht 62.0 in | Wt 130.1 lb

## 2023-05-09 DIAGNOSIS — J328 Other chronic sinusitis: Secondary | ICD-10-CM | POA: Diagnosis not present

## 2023-05-09 DIAGNOSIS — J329 Chronic sinusitis, unspecified: Secondary | ICD-10-CM

## 2023-05-09 MED ORDER — XHANCE 93 MCG/ACT NA EXHU: 2.0000 | INHALANT_SUSPENSION | Freq: Two times a day (BID) | NASAL | 5 refills | Status: DC

## 2023-05-09 NOTE — Patient Instructions (Addendum)
 Chronic rhinosinusitis - Chronic left ear pain with congestion Chronic left ear pain likely due to eustachian tube dysfunction from nasal congestion. Inconsistent use of Flonase due to taste aversion. Recommended Xhance for better delivery and reduced taste issues. - Use Xhance nasal spray 2 sprays each nostril twice a day for now until congestion/ear symptom improve.  This is a special device that allows for deeper delivery of medication into the sinuses.  This will have the best chance of reducing inflammation around the eustachian tube.  Provided Xhance samples today.  - Recommend performing nasal saline rinses to flush out the nasal/sinus cavity.  Rinse kit provided.  Use distilled or boiled water (let cool to room temp prior to use) and keep mouth open entire time of rinse.   - if Xhance not covered and you tolerate perform saline rinse then we can perform steroid rinses.  If not able to perform saline rinses then consider OTC nasal steroid sprays like Rhinocort, Nasacort, or Nasonex as these do not have a taste.   - Schedule skin testing for environmental allergens, hold antihistamines for three days prior to this visit. - Continue daily Cetirizine 10mg .    - Consider Allegra if cetirizine is ineffective.  Return for skin testing Routine follow-up visit in 3-4 months

## 2023-05-09 NOTE — Progress Notes (Signed)
 New Patient Note  RE: DENEKA GREENWALT MRN: [id number] DOB: 1980-12-30 Date of Office Visit: 05/09/2023  Primary care provider: no, PCP  Chief Complaint: ear pain, allergies  History of present illness: ARIJANA NARAYAN is a 43 y.o. female presenting today for evaluation of eustachian tube dysfunction and allergies.  Discussed the use of AI scribe software for clinical note transcription with the patient, who gave verbal consent to proceed.  She has experienced consistent stuffiness and left ear pain for the past couple of years. The ear pain began before exposure to her partner's long-haired [place] shepherd, suggesting the dog may not be the cause. No recent sinus infections have been noted.  The nasal congestion is bilateral. She denies any eye symptoms such as itchiness, redness, or watering. Occasional sneezing and throat clearing occur, with 'tickles in my throat' sometimes waking her at night, prompting her to drink water.  She has been using cetirizine, particularly when visiting her partner's house, and has also tried Xyzal. Cetirizine is taken more regularly now due to the weather and pollen and feels the Cetirizine is more effective than Xyzal. Benadryl and Sudafed are used occasionally.  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  She previously consulted an ENT specialist who recommended Flonase, but she does not use it often due to disliking the taste. She has tried using an Product manager and humidifier at home to alleviate symptoms. No history of asthma, eczema, or food allergies. She has not tried saline rinses but is open to trying them.     Review of systems: 10pt ROS negative unless noted above in HPI  Past medical history: Past Medical History:  Diagnosis Date   Anxiety    Depression    Migraine     Past surgical history: Past Surgical History:  Procedure Laterality Date   ESSURE TUBAL LIGATION  2011   uterine ablation  2018     Family history:  Family History  Problem Relation Age of Onset   Headache Mother    Eczema Father    Allergic rhinitis Father    Breast cancer Paternal Aunt 3   Emphysema Maternal Grandfather    Breast cancer Paternal Grandmother 50   Leukemia Paternal Grandfather    Asthma Neg Hx    Urticaria Neg Hx     Social history: Lives in a house with carpeting with electric heating and central cooling.  Her partner has a long and a [place].  She has a schnoodle that doesn't shed.  There is no concern for water damage, mildew or roaches in the home.  She does spend time at her parents home where there is concern for roaches.  She is a Paramedic.  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 She is in the process of moving into the partners home. Tobacco Use   Smoking status: Every Day    Current packs/day: Less than 1 pack/day    Types: Cigarettes    Passive exposure: Never   Smokeless tobacco: Never   Tobacco comments:    2 packs per week  Vaping Use   Vaping status: Never Used   Medication List: Current Outpatient Medications  Medication Sig Dispense Refill   ALPRAZolam (XANAX) 0.5 MG tablet Take 0.5 mg by mouth 3 (three) times daily as needed.     buPROPion (WELLBUTRIN XL) 150 MG 24 hr tablet Take 150 mg by mouth daily.  diphenhydrAMINE (BENADRYL) 25 mg capsule Take 25 mg by mouth every 6 (six) hours as needed.     fluticasone (FLONASE) 50 MCG/ACT nasal spray Instill 2 puffs each nostril every night.     ibuprofen (ADVIL) 200 MG tablet Take 400 mg by mouth daily as needed.     Multiple Vitamin (MULTIVITAMIN PO) Take by mouth daily. OTC medication     predniSONE (STERAPRED UNI-PAK 21 TAB) 10 MG (21) TBPK tablet Take as directed 21 tablet 3   rizatriptan (MAXALT-MLT) 10 MG disintegrating tablet Take 1 tablet (10 mg total) by mouth as needed for migraine. May repeat in 2 hours if needed 9 tablet 11   topiramate (TOPAMAX) 25 MG tablet Take 1  tablet (25 mg total) by mouth at bedtime. Must keep pending appointment for further refills. 90 tablet 4   Triprolidine-Pseudoephedrine (ANTIHISTAMINE PO) Take 1 tablet by mouth as needed.     No current facility-administered medications for this visit.    Known medication allergies: No Known Allergies   Physical examination: Blood pressure 128/78, pulse (!) 106, temperature 98 F (36.7 C), temperature source Temporal, resp. rate 18, height 5\' 2"  (1.575 m), weight 130 lb 1.6 oz (59 kg), SpO2 99%.  General: Alert, interactive, in no acute distress. HEENT: PERRLA, TMs pearly gray, turbinates moderately edematous without discharge, post-pharynx non erythematous. Neck: Supple without lymphadenopathy. Lungs: Clear to auscultation without wheezing, rhonchi or rales. {no increased work of breathing. CV: Normal S1, S2 without murmurs. Abdomen: Nondistended, nontender. Skin: Warm and dry, without lesions or rashes. Extremities:  No clubbing, cyanosis or edema. Neuro:   Grossly intact.  Diagnositics/Labs:  Allergy testing: unable to perform due to recent antihistamine use  Assessment and plan: Chronic rhinosinusitis - Chronic left ear pain with congestion Chronic left ear pain likely due to eustachian tube dysfunction from nasal congestion. Inconsistent use of Flonase due to taste aversion. Recommended Xhance for better delivery and reduced taste issues. - Use Xhance nasal spray 2 sprays each nostril twice a day for now until congestion/ear symptom improve.  This is a special device that allows for deeper delivery of medication into the sinuses.  This will have the best chance of reducing inflammation around the eustachian tube.  Provided Xhance samples today.  - Recommend performing nasal saline rinses to flush out the nasal/sinus cavity.  Rinse kit provided.  Use distilled or boiled water (let cool to room temp prior to use) and keep mouth open entire time of rinse.   - if Xhance not covered  and you tolerate perform saline rinse then we can perform steroid rinses.  If not able to perform saline rinses then consider OTC nasal steroid sprays like Rhinocort, Nasacort, or Nasonex as these do not have a taste.   - Schedule skin testing for environmental allergens, hold antihistamines for three days prior to this visit. - Continue daily Cetirizine 10mg .    - Consider Allegra if cetirizine is ineffective.  Return for skin testing (env 1-55) Routine follow-up visit in 3-4 months  I appreciate the opportunity to take part in Icyss's care. Please do not hesitate to contact me with questions.  Sincerely,   Margo Aye, MD Allergy/Immunology [facility]

## 2023-05-14 ENCOUNTER — Encounter: Payer: Self-pay | Admitting: Orthopaedic Surgery

## 2023-05-14 ENCOUNTER — Telehealth: Payer: Self-pay

## 2023-05-14 NOTE — Telephone Encounter (Signed)
*  Asthma/Allergy  Pharmacy Patient Advocate Encounter   Received notification from Fax that prior authorization for Connie Roach is required/requested.   Insurance verification completed.   The patient is insured through [facility] .   Per test claim: PA required; PA submitted to above mentioned insurance via Fax Key/confirmation #[id number] N/A Status is pending

## 2023-05-15 ENCOUNTER — Ambulatory Visit: Admitting: Allergy

## 2023-05-15 ENCOUNTER — Encounter: Payer: Self-pay | Admitting: Allergy

## 2023-05-15 DIAGNOSIS — J329 Chronic sinusitis, unspecified: Secondary | ICD-10-CM

## 2023-05-15 MED ORDER — XHANCE 93 MCG/ACT NA EXHU: 2.0000 | INHALANT_SUSPENSION | Freq: Two times a day (BID) | NASAL | Status: DC

## 2023-05-15 NOTE — Patient Instructions (Signed)
 Chronic rhinosinusitis - Chronic left ear pain with congestion Chronic left ear pain likely due to eustachian tube dysfunction from nasal congestion. Inconsistent use of Flonase due to taste aversion. Recommended Xhance for better delivery and reduced taste issues. - Use Xhance nasal spray 2 sprays each nostril twice a day for now until congestion/ear symptom improve.  This is a special device that allows for deeper delivery of medication into the sinuses.  This will have the best chance of reducing inflammation around the eustachian tube.   -Perform nasal saline rinses to flush out the nasal/sinus cavity. Use distilled or boiled water (let cool to room temp prior to use) and keep mouth open entire time of rinse.   - if Xhance not covered and you tolerate performing saline rinse then we can perform steroid rinses.  If not able to perform saline rinses then consider OTC nasal steroid sprays like Rhinocort, Nasacort, or Nasonex as these do not have a taste.   - Continue daily Cetirizine 10mg .    - Consider Allegra if cetirizine is ineffective. - Testing today showed: grasses, ragweed, weeds, trees, indoor molds, outdoor molds, horse, and tobacco - Copy of test results provided.  - Avoidance measures provided. - Consider allergy shots as a means of long-term control. - Allergy shots "re-train" and "reset" the immune system to ignore environmental allergens and decrease the resulting immune response to those allergens (sneezing, itchy watery eyes, runny nose, nasal congestion, etc).    - Allergy shots improve symptoms in 75-85% of patients.  - We can discuss more at  a future appointment if the medications are not working for you.   Routine follow-up visit in 3-4 months

## 2023-05-15 NOTE — Progress Notes (Signed)
 Medication Samples have been provided to the patient.  Drug name: Timmothy Sours       Strength: 93        Qty: 1  LOT: [id number]  Exp.Date: 03/20/2024  Dosing instructions: 2 spray in each nostril twice daily  The patient has been instructed regarding the correct time, dose, and frequency of taking this medication, including desired effects and most common side effects.   Rosaland Shiffman 10:53 AM 05/15/2023

## 2023-05-15 NOTE — Progress Notes (Signed)
 Follow-up Note  RE: Connie Roach MRN: [id number] DOB: [birth year] Date of Office Visit: 05/15/2023   History of present illness: Connie Roach is a 43 y.o. female presenting today for skin testing visit.  She was last in the office on 05/09/2023 by myself for chronic rhinosinusitis.  She is in her usual state of health today without recent illness or antibiotic needs.  She has not had any antihistamines in the past 3 days for testing today.  Medication List: Current Outpatient Medications  Medication Sig Dispense Refill   ALPRAZolam (XANAX) 0.5 MG tablet Take 0.5 mg by mouth 3 (three) times daily as needed.     buPROPion (WELLBUTRIN XL) 150 MG 24 hr tablet Take 150 mg by mouth daily.     diphenhydrAMINE (BENADRYL) 25 mg capsule Take 25 mg by mouth every 6 (six) hours as needed.     fluticasone (FLONASE) 50 MCG/ACT nasal spray Instill 2 puffs each nostril every night.     Fluticasone Propionate (XHANCE) 93 MCG/ACT EXHU Place 2 sprays into the nose in the morning and at bedtime. 32 mL 5   ibuprofen (ADVIL) 200 MG tablet Take 400 mg by mouth daily as needed.     Multiple Vitamin (MULTIVITAMIN PO) Take by mouth daily. OTC medication     predniSONE (STERAPRED UNI-PAK 21 TAB) 10 MG (21) TBPK tablet Take as directed 21 tablet 3   rizatriptan (MAXALT-MLT) 10 MG disintegrating tablet Take 1 tablet (10 mg total) by mouth as needed for migraine. May repeat in 2 hours if needed 9 tablet 11   topiramate (TOPAMAX) 25 MG tablet Take 1 tablet (25 mg total) by mouth at bedtime. Must keep pending appointment for further refills. 90 tablet 4   Triprolidine-Pseudoephedrine (ANTIHISTAMINE PO) Take 1 tablet by mouth as needed.     No current facility-administered medications for this visit.     Known medication allergies: No Known Allergies  Diagnostics/Labs:  Allergy testing:   Airborne Adult Perc - 05/15/23 0822     Time Antigen Placed 8295    Allergen Manufacturer Waynette Buttery    Location Back     Number of Test 55    Panel 1 Select    1. Control-Buffer 50% Glycerol Negative    2. Control-Histamine 2+    3. Bahia Negative    4. French Southern Territories Negative    5. Johnson Negative    6. Kentucky Blue Negative    7. Meadow Fescue 2+    8. Perennial Rye Negative    9. Timothy Negative    10. Ragweed Mix 2+    11. Cocklebur Negative    12. Plantain,  English 2+    13. Baccharis Negative    14. Dog Fennel Negative    15. Russian Thistle Negative    16. Lamb's Quarters Negative    17. Sheep Sorrell Negative    18. Rough Pigweed Negative    19. Marsh Elder, Rough Negative    20. Mugwort, Common Negative    21. Box, Elder Negative    22. Cedar, red 2+    23. Sweet Gum Negative    24. Pecan Pollen Negative    25. Pine Mix Negative    26. Walnut, Black Pollen Negative    27. Red Mulberry Negative    28. Ash Mix Negative    29. Birch Mix Negative    30. Beech American Negative    31. Cottonwood, Guinea-Bissau Negative    32. Hickory, White Negative  33. Maple Mix Negative    34. Oak, Guinea-Bissau Mix Negative    35. Sycamore Eastern Negative    36. Alternaria Alternata Negative    37. Cladosporium Herbarum Negative    38. Aspergillus Mix 3+    39. Penicillium Mix 2+    40. Bipolaris Sorokiniana (Helminthosporium) 2+    41. Drechslera Spicifera (Curvularia) Negative    42. Mucor Plumbeus Negative    43. Fusarium Moniliforme 2+    44. Aureobasidium Pullulans (pullulara) Negative    45. Rhizopus Oryzae Negative    46. Botrytis Cinera Negative    47. Epicoccum Nigrum Negative    48. Phoma Betae Negative    49. Dust Mite Mix Negative    50. Cat Hair 10,000 BAU/ml Negative    51.  Dog Epithelia Negative    52. Mixed Feathers Negative    53. Horse Epithelia 2+    54. Cockroach, German Negative    55. Tobacco Leaf 2+             Intradermal - 05/15/23 0855     Time Antigen Placed 7846    Allergen Manufacturer Waynette Buttery    Location Arm    Number of Test 8    Intradermal Select     Control Negative    Weed Mix Negative    Tree Mix Negative    Mold 1 Negative    Mite Mix Negative    Cat Negative    Dog Negative    Cockroach Negative             Allergy testing results were read and interpreted by provider, documented by clinical staff.   Assessment and plan: Chronic rhinosinusitis - Chronic left ear pain with congestion Chronic left ear pain likely due to eustachian tube dysfunction from nasal congestion. Inconsistent use of Flonase due to taste aversion. Recommended Xhance for better delivery and reduced taste issues. - Use Xhance nasal spray 2 sprays each nostril twice a day for now until congestion/ear symptom improve.  This is a special device that allows for deeper delivery of medication into the sinuses.  This will have the best chance of reducing inflammation around the eustachian tube.   -Perform nasal saline rinses to flush out the nasal/sinus cavity. Use distilled or boiled water (let cool to room temp prior to use) and keep mouth open entire time of rinse.   - if Xhance not covered and you tolerate performing saline rinse then we can perform steroid rinses.  If not able to perform saline rinses then consider OTC nasal steroid sprays like Rhinocort, Nasacort, or Nasonex as these do not have a taste.   - Continue daily Cetirizine 10mg .    - Consider Allegra if cetirizine is ineffective. - Testing today showed: grasses, ragweed, weeds, trees, indoor molds, outdoor molds, horse, and tobacco - Copy of test results provided.  - Avoidance measures provided. - Consider allergy shots as a means of long-term control. - Allergy shots "re-train" and "reset" the immune system to ignore environmental allergens and decrease the resulting immune response to those allergens (sneezing, itchy watery eyes, runny nose, nasal congestion, etc).    - Allergy shots improve symptoms in 75-85% of patients.  - We can discuss more at  a future appointment if the medications are not  working for you.   Routine follow-up visit in 3-4 months  Return in about 3 months (around 08/15/2023).  I appreciate the opportunity to take part in Carline's care. Please do not hesitate  to contact me with questions.  Sincerely,   Margo Aye, MD Allergy/Immunology [facility]
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
## 2023-05-21 ENCOUNTER — Ambulatory Visit: Attending: Orthopaedic Surgery | Admitting: Physical Therapy

## 2023-05-21 ENCOUNTER — Encounter: Payer: Self-pay | Admitting: Physical Therapy

## 2023-05-21 ENCOUNTER — Other Ambulatory Visit (HOSPITAL_COMMUNITY): Payer: Self-pay

## 2023-05-21 ENCOUNTER — Other Ambulatory Visit: Payer: Self-pay

## 2023-05-21 DIAGNOSIS — M5459 Other low back pain: Secondary | ICD-10-CM | POA: Diagnosis present

## 2023-05-21 NOTE — Telephone Encounter (Signed)
 Now covered- $4.00 copay per test claims
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
## 2023-05-28 ENCOUNTER — Encounter: Payer: Self-pay | Admitting: Physical Therapy

## 2023-05-28 ENCOUNTER — Ambulatory Visit: Admitting: Physical Therapy

## 2023-05-28 DIAGNOSIS — M5459 Other low back pain: Secondary | ICD-10-CM
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
## 2023-05-30 ENCOUNTER — Encounter: Payer: Self-pay | Admitting: Physical Therapy

## 2023-05-30 ENCOUNTER — Ambulatory Visit: Admitting: Physical Therapy

## 2023-05-30 DIAGNOSIS — M5459 Other low back pain: Secondary | ICD-10-CM | POA: Diagnosis not present

## 2023-06-02 ENCOUNTER — Ambulatory Visit: Admitting: Physical Therapy

## 2023-06-02 ENCOUNTER — Encounter: Payer: Self-pay | Admitting: Physical Therapy

## 2023-06-02 DIAGNOSIS — M5459 Other low back pain: Secondary | ICD-10-CM
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
## 2023-06-04 ENCOUNTER — Ambulatory Visit

## 2023-06-05 DIAGNOSIS — M47819 Spondylosis without myelopathy or radiculopathy, site unspecified: Secondary | ICD-10-CM | POA: Insufficient documentation

## 2023-06-05 LAB — HM PAP SMEAR: HM Pap smear: NEGATIVE
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
## 2023-06-13 ENCOUNTER — Encounter: Payer: Self-pay | Admitting: Physical Therapy

## 2023-06-13 ENCOUNTER — Ambulatory Visit: Admitting: Physical Therapy

## 2023-06-13 DIAGNOSIS — M5459 Other low back pain: Secondary | ICD-10-CM

## 2023-06-27 ENCOUNTER — Ambulatory Visit

## 2023-06-27 ENCOUNTER — Ambulatory Visit: Attending: Orthopaedic Surgery | Admitting: Physical Therapy

## 2023-06-27 ENCOUNTER — Encounter: Payer: Self-pay | Admitting: Physical Therapy

## 2023-06-27 DIAGNOSIS — M5459 Other low back pain: Secondary | ICD-10-CM | POA: Insufficient documentation
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
## 2023-07-01 ENCOUNTER — Ambulatory Visit (INDEPENDENT_AMBULATORY_CARE_PROVIDER_SITE_OTHER): Admitting: Orthopaedic Surgery

## 2023-07-01 DIAGNOSIS — G8929 Other chronic pain: Secondary | ICD-10-CM | POA: Diagnosis not present

## 2023-07-01 DIAGNOSIS — M545 Low back pain, unspecified: Secondary | ICD-10-CM

## 2023-07-01 NOTE — Progress Notes (Signed)
 Office Visit Note   Patient: Connie Roach           Date of Birth: 08-28-1980           MRN: [id number] Visit Date: 07/01/2023              Requested by: Thurman Flores, MD [address] [place],  [place] [address] PCP: Thurman Flores, MD   Assessment & Plan: Visit Diagnoses:  1. Chronic right-sided low back pain without sciatica     Plan: History of Present Illness Connie Roach is a 43 year old female who presents with persistent back pain despite physical therapy.  She experiences back pain that disrupts her sleep, causing her to wake up and be unable to return to sleep due to discomfort. She uses a heating pad and performs stretches at night and in the morning to alleviate the pain. Increased physical activity improves her symptoms, but she cannot incorporate this into her work routine.  She has completed six physical therapy sessions. 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  She is considering an MRI to further investigate her symptoms. The presence of an Essure device may affect her ability to undergo an MRI due to its metal components.  Assessment and Plan Back pain Chronic back pain unresponsive to physical therapy and steroids. MRI considered to identify underlying cause, with Essure device as potential contraindication. - Order MRI of the back. - Coordinate with imaging center regarding Essure device contraindication. - Advise continuation of home physical therapy exercises. - Plan follow-up with Dr. Daisey Dryer or Elvan Hamel, NP, post-MRI results.  Follow-Up Instructions: No follow-ups on file.   Orders:  Orders Placed This Encounter  Procedures   MR Lumbar Spine w/o contrast   No orders of the defined types were placed in this encounter.     Procedures: No procedures performed   Clinical Data: No additional  findings.   Subjective: Chief Complaint  Patient presents with   Lower Back - Pain    HPI  Review of Systems  Constitutional: Negative.   HENT: Negative.    Eyes: Negative.   Respiratory: Negative.    Cardiovascular: Negative.   Endocrine: Negative.   Musculoskeletal: Negative.   Neurological: Negative.   Hematological: Negative.   Psychiatric/Behavioral: Negative.    All other systems reviewed and are negative.    Objective: Vital Signs: There were no vitals taken for this visit.  Physical Exam Vitals and nursing note reviewed.  Constitutional:      Appearance: She is well-developed.  HENT:     Head: Normocephalic and atraumatic.  Pulmonary:     Effort: Pulmonary effort is normal.  Abdominal:     Palpations: Abdomen is soft.  Musculoskeletal:     Cervical back: Neck supple.  Skin:    General: Skin is warm.     Capillary Refill: Capillary refill takes less than 2 seconds.  Neurological:     Mental Status: She is alert and oriented to person, place, and time.  Psychiatric:        Behavior: Behavior normal.        Thought Content: Thought content normal.        Judgment: Judgment normal.     Ortho Exam  Specialty Comments:  No specialty comments available.  Imaging: No results found.   PMFS History: Patient Active Problem List   Diagnosis Date  Noted   Migraine 11/14/2022   Eustachian tube dysfunction, bilateral 04/23/2021   Low grade squamous intraepithelial lesion (LGSIL) on cervicovaginal cytologic smear 11/02/2019   Medication overuse headache 12/10/2018   Chronic migraine without aura without status migrainosus, not intractable 12/09/2018   Acute bacterial sinusitis 11/10/2013   Left ear pain 10/30/2010   Past Medical History:  Diagnosis Date   Anxiety    Depression    Migraine     Family History  Problem Relation Age of Onset   Headache Mother    Eczema Father    Allergic rhinitis Father    Breast cancer Paternal Aunt 98    Emphysema Maternal Grandfather    Breast cancer Paternal Grandmother 15   Leukemia Paternal Grandfather    Asthma Neg Hx    Urticaria Neg Hx     Past Surgical History:  Procedure Laterality Date   ESSURE TUBAL LIGATION  2011   uterine ablation  2018   Social History   Occupational History   Not on file  Tobacco Use   Smoking status: Every Day    Current packs/day: 1.50    Types: Cigarettes    Passive exposure: Never   Smokeless tobacco: Never   Tobacco comments:    2 packs per week  Vaping Use   Vaping status: Never Used  Substance and Sexual Activity   Alcohol use: Yes    Alcohol/week: 4.0 standard drinks of alcohol    Types: 4 Standard drinks or equivalent per week   Drug use: Not Currently    Frequency: 1.0 times per week    Types: Marijuana   Sexual activity: Not on file
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
## 2023-07-03 ENCOUNTER — Ambulatory Visit: Admitting: Physical Therapy

## 2023-07-03 ENCOUNTER — Encounter: Payer: Self-pay | Admitting: Physical Therapy

## 2023-07-03 DIAGNOSIS — M5459 Other low back pain: Secondary | ICD-10-CM | POA: Diagnosis not present
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
## 2023-07-16 ENCOUNTER — Ambulatory Visit: Admitting: Physical Therapy

## 2023-07-16 ENCOUNTER — Encounter: Payer: Self-pay | Admitting: Physical Therapy

## 2023-07-16 DIAGNOSIS — M5459 Other low back pain: Secondary | ICD-10-CM

## 2023-07-17 ENCOUNTER — Encounter: Payer: Self-pay | Admitting: Orthopaedic Surgery

## 2023-07-19 ENCOUNTER — Ambulatory Visit
Admission: RE | Admit: 2023-07-19 | Discharge: 2023-07-19 | Disposition: A | Discharge status: Home / Self Care | Source: Ambulatory Visit | Attending: Orthopaedic Surgery | Admitting: Orthopaedic Surgery

## 2023-07-19 DIAGNOSIS — G8929 Other chronic pain: Secondary | ICD-10-CM

## 2023-07-21 ENCOUNTER — Encounter: Payer: Self-pay | Admitting: Physical Therapy

## 2023-07-21 ENCOUNTER — Ambulatory Visit: Attending: Orthopaedic Surgery | Admitting: Physical Therapy

## 2023-07-21 ENCOUNTER — Other Ambulatory Visit

## 2023-07-21 DIAGNOSIS — M5459 Other low back pain: Secondary | ICD-10-CM | POA: Diagnosis present
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
## 2023-07-22 ENCOUNTER — Ambulatory Visit: Payer: Self-pay | Admitting: Orthopaedic Surgery

## 2023-07-22 ENCOUNTER — Encounter: Payer: Self-pay | Admitting: Orthopaedic Surgery

## 2023-07-28 ENCOUNTER — Encounter: Payer: Self-pay | Admitting: Physical Therapy

## 2023-07-28 ENCOUNTER — Ambulatory Visit: Admitting: Physical Therapy

## 2023-07-28 DIAGNOSIS — M5459 Other low back pain: Secondary | ICD-10-CM
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
## 2023-08-04 ENCOUNTER — Encounter: Payer: Self-pay | Admitting: Physical Therapy

## 2023-08-04 ENCOUNTER — Ambulatory Visit: Admitting: Physical Therapy

## 2023-08-04 DIAGNOSIS — M5459 Other low back pain: Secondary | ICD-10-CM | POA: Diagnosis not present
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
## 2023-08-11 ENCOUNTER — Encounter: Payer: Self-pay | Admitting: Physical Therapy

## 2023-08-11 ENCOUNTER — Ambulatory Visit: Admitting: Physical Therapy

## 2023-08-11 DIAGNOSIS — M5459 Other low back pain: Secondary | ICD-10-CM
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
## 2023-08-13 ENCOUNTER — Encounter: Payer: Self-pay | Admitting: Allergy

## 2023-08-13 ENCOUNTER — Other Ambulatory Visit: Payer: Self-pay

## 2023-08-13 ENCOUNTER — Ambulatory Visit (INDEPENDENT_AMBULATORY_CARE_PROVIDER_SITE_OTHER): Admitting: Allergy

## 2023-08-13 VITALS — BP 102/66 | HR 84 | Temp 98.7°F | Wt 132.0 lb

## 2023-08-13 DIAGNOSIS — J329 Chronic sinusitis, unspecified: Secondary | ICD-10-CM | POA: Diagnosis not present

## 2023-08-13 DIAGNOSIS — H6993 Unspecified Eustachian tube disorder, bilateral: Secondary | ICD-10-CM

## 2023-08-13 MED ORDER — XHANCE 93 MCG/ACT NA EXHU: 2.0000 | INHALANT_SUSPENSION | Freq: Two times a day (BID) | NASAL | 11 refills | Status: DC

## 2023-08-13 MED ORDER — CETIRIZINE HCL 10 MG PO CAPS: 10.0000 mg | ORAL_CAPSULE | Freq: Every day | ORAL | 11 refills | Status: AC

## 2023-08-13 MED ORDER — XHANCE 93 MCG/ACT NA EXHU: 2.0000 | INHALANT_SUSPENSION | Freq: Two times a day (BID) | NASAL | 11 refills | Status: AC

## 2023-08-13 NOTE — Patient Instructions (Addendum)
 Chronic rhinosinusitis  Chronic left ear pain with congestion--IMPROVED with Xhance  use.  - Use Xhance  nasal spray 1-2 sprays each nostril 1-2 times a day for congestion/ear pain control.  Xhance  is a special device that allows for deeper delivery of medication into the sinuses.   - Continue daily Cetirizine 10mg .   If stopping then recommend a taper/wean off approach to prevent rare rebound itch - Consider Allegra if cetirizine is ineffective. - Continue avoidance measures for grasses, ragweed, weeds, trees, indoor molds, outdoor molds, horse, and tobacco - Consider allergy  shots as a means of long-term control if medication management is not effective enough. - Allergy  shots re-train and reset the immune system to ignore environmental allergens and decrease the resulting immune response to those allergens (sneezing, itchy watery eyes, runny nose, nasal congestion, etc).    - Allergy  shots improve symptoms in 75-85% of patients.  - We can discuss more at a future appointment if the medications are not working for you.   Routine follow-up visit in 12 months

## 2023-08-13 NOTE — Progress Notes (Unsigned)
 Follow-up Note  RE: Connie Roach MRN: [id number] DOB: 1980-04-05 Date of Office Visit: 08/13/2023   History of present illness: Connie Roach is a 43 y.o. female presenting today for follow-up of chronic rhinosinusitis with eustachian tube dysfunction.  She was last seen in the office on 05/15/2023 by myself. Discussed the use of AI scribe software for clinical note transcription with the patient, who gave verbal consent to proceed.  She experiences occasional symptoms of allergic rhinitis, which have improved significantly with the use of Xhance  nasal spray.  She states the ear pain has greatly diminished which she is happy about.  She applies the spray once in each nostril, typically twice a day, but sometimes only once a day due to forgetfulness.She has encountered issues with pharmacy availability of Xhance , but [facility]. She received samples from  my office at the last visit that she had been using, which she found helpful as she could keep them 1 at work and another in a different place to ensure consistent use.  She takes cetirizine every morning, which she feels is effective.    Review of systems: 10pt ROS negative unless noted above in HPI   Past medical/social/surgical/family history have been reviewed and are unchanged unless specifically indicated below.  No changes  Medication List: Current Outpatient Medications  Medication Sig Dispense Refill   ALPRAZolam (XANAX) 0.5 MG tablet Take 0.5 mg by mouth 3 (three) times daily as needed.     buPROPion (WELLBUTRIN XL) 150 MG 24 hr tablet Take 150 mg by mouth daily.     diphenhydrAMINE (BENADRYL) 25 mg capsule Take 25 mg by mouth every 6 (six) hours as needed.     fluticasone (FLONASE) 50 MCG/ACT nasal spray Instill 2 puffs each nostril every night.     ibuprofen (ADVIL) 200 MG tablet Take 400 mg by mouth daily as needed.     Multiple Vitamin (MULTIVITAMIN PO) Take by mouth daily. OTC  medication     Cetirizine HCl 10 MG CAPS Take 1 capsule (10 mg total) by mouth daily. 30 capsule 11   Fluticasone Propionate  (XHANCE ) 93 MCG/ACT EXHU Place 2 sprays into both nostrils 2 (two) times daily. Use Xhance  nasal spray 1-2 sprays each nostril 1-2 times a day for congestion/ear pain contro 16 mL 11   Fluticasone Propionate  (XHANCE ) 93 MCG/ACT EXHU Place 2 sprays into the nose in the morning and at bedtime. 32 mL 11   rizatriptan  (MAXALT -MLT) 10 MG disintegrating tablet Take 1 tablet (10 mg total) by mouth as needed for migraine. May repeat in 2 hours if needed (Patient not taking: Reported on 08/13/2023) 9 tablet 11   topiramate  (TOPAMAX ) 25 MG tablet Take 1 tablet (25 mg total) by mouth at bedtime. Must keep pending appointment for further refills. (Patient not taking: Reported on 08/13/2023) 90 tablet 4   Triprolidine-Pseudoephedrine (ANTIHISTAMINE PO) Take 1 tablet by mouth as needed. (Patient not taking: Reported on 08/13/2023)     No current facility-administered medications for this visit.     Known medication allergies: No Known Allergies   Physical examination: Blood pressure 102/66, pulse 84, temperature 98.7 F (37.1 C), temperature source Temporal, weight 132 lb (59.9 kg), SpO2 98%.  General: Alert, interactive, in no acute distress. HEENT: PERRLA, TMs pearly gray, turbinates non-edematous without discharge, post-pharynx non erythematous. Neck: Supple without lymphadenopathy. Lungs: Clear to auscultation without wheezing, rhonchi or rales. {no increased work of breathing. CV: Normal S1, S2 without murmurs.  Abdomen: Nondistended, nontender. Skin: Warm and dry, without lesions or rashes. Extremities:  No clubbing, cyanosis or edema. Neuro:   Grossly intact.  Diagnostics/Labs: None today  Assessment and plan:   Chronic rhinosinusitis  Eustachian tube dysfunction Chronic left ear pain with congestion--IMPROVED with Xhance  use.  - Use Xhance  nasal spray 1-2 sprays each  nostril 1-2 times a day for congestion/ear pain control.  Xhance  is a special device that allows for deeper delivery of medication into the sinuses.   - Continue daily Cetirizine 10mg .   If stopping then recommend a taper/wean off approach to prevent rare rebound itch - Consider Allegra if cetirizine is ineffective. - Continue avoidance measures for grasses, ragweed, weeds, trees, indoor molds, outdoor molds, horse, and tobacco - Consider allergy  shots as a means of long-term control if medication management is not effective enough. - Allergy  shots re-train and reset the immune system to ignore environmental allergens and decrease the resulting immune response to those allergens (sneezing, itchy watery eyes, runny nose, nasal congestion, etc).    - Allergy  shots improve symptoms in 75-85% of patients.  - We can discuss more at a future appointment if the medications are not working for you.   Routine follow-up visit in 12 months  Return in about 1 year (around 08/12/2024).  I appreciate the opportunity to take part in [name]'s care. Please do not hesitate to contact me with questions.  Sincerely,   Danita Brain, MD Allergy /Immunology [facility]
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
## 2023-08-21 ENCOUNTER — Ambulatory Visit: Attending: Orthopaedic Surgery | Admitting: Physical Therapy

## 2023-08-21 ENCOUNTER — Encounter: Payer: Self-pay | Admitting: Physical Therapy

## 2023-08-21 DIAGNOSIS — M5459 Other low back pain: Secondary | ICD-10-CM | POA: Diagnosis present

## 2023-08-27 ENCOUNTER — Encounter: Payer: Self-pay | Admitting: Family Medicine

## 2023-08-27 ENCOUNTER — Ambulatory Visit: Admitting: Family Medicine

## 2023-08-27 VITALS — BP 118/80 | HR 87 | Temp 98.4°F | Ht 62.0 in | Wt 131.0 lb

## 2023-08-27 DIAGNOSIS — K5909 Other constipation: Secondary | ICD-10-CM

## 2023-08-27 DIAGNOSIS — Z136 Encounter for screening for cardiovascular disorders: Secondary | ICD-10-CM

## 2023-08-27 DIAGNOSIS — Z6823 Body mass index (BMI) 23.0-23.9, adult: Secondary | ICD-10-CM

## 2023-08-27 DIAGNOSIS — Z7689 Persons encountering health services in other specified circumstances: Secondary | ICD-10-CM

## 2023-08-27 DIAGNOSIS — K219 Gastro-esophageal reflux disease without esophagitis: Secondary | ICD-10-CM

## 2023-08-27 LAB — CBC WITH DIFFERENTIAL/PLATELET
Basophils Absolute: 0 K/uL (ref 0.0–0.1)
Basophils Relative: 0.3 % (ref 0.0–3.0)
Eosinophils Absolute: 0.1 K/uL (ref 0.0–0.7)
Eosinophils Relative: 1 % (ref 0.0–5.0)
HCT: 43.6 % (ref 36.0–46.0)
Hemoglobin: 14.4 g/dL (ref 12.0–15.0)
Lymphocytes Relative: 27.1 % (ref 12.0–46.0)
Lymphs Abs: 1.6 K/uL (ref 0.7–4.0)
MCHC: 33.2 g/dL (ref 30.0–36.0)
MCV: 95.2 fl (ref 78.0–100.0)
Monocytes Absolute: 0.4 K/uL (ref 0.1–1.0)
Monocytes Relative: 6.7 % (ref 3.0–12.0)
Neutro Abs: 3.8 K/uL (ref 1.4–7.7)
Neutrophils Relative %: 64.9 % (ref 43.0–77.0)
Platelets: 250 K/uL (ref 150.0–400.0)
RBC: 4.58 Mil/uL (ref 3.87–5.11)
RDW: 13.4 % (ref 11.5–15.5)
WBC: 5.8 K/uL (ref 4.0–10.5)

## 2023-08-27 LAB — COMPREHENSIVE METABOLIC PANEL WITH GFR
ALT: 12 U/L (ref 0–35)
AST: 17 U/L (ref 0–37)
Albumin: 4.7 g/dL (ref 3.5–5.2)
Alkaline Phosphatase: 41 U/L (ref 39–117)
BUN: 14 mg/dL (ref 6–23)
CO2: 28 meq/L (ref 19–32)
Calcium: 9.3 mg/dL (ref 8.4–10.5)
Chloride: 104 meq/L (ref 96–112)
Creatinine, Ser: 0.81 mg/dL (ref 0.40–1.20)
GFR: 89.95 mL/min (ref >60.00)
Glucose, Bld: 91 mg/dL (ref 70–99)
Potassium: 3.7 meq/L (ref 3.5–5.1)
Sodium: 138 meq/L (ref 135–145)
Total Bilirubin: 0.5 mg/dL (ref 0.2–1.2)
Total Protein: 7.5 g/dL (ref 6.0–8.3)

## 2023-08-27 LAB — LIPID PANEL
Cholesterol: 201 mg/dL — ABNORMAL HIGH (ref 0–200)
HDL: 53.7 mg/dL (ref >39.00)
LDL Cholesterol: 122 mg/dL — ABNORMAL HIGH (ref 0–99)
NonHDL: 147.39
Total CHOL/HDL Ratio: 4
Triglycerides: 125 mg/dL (ref 0.0–149.0)
VLDL: 25 mg/dL (ref 0.0–40.0)

## 2023-08-27 LAB — TSH: TSH: 1.21 u[IU]/mL (ref 0.35–5.50)

## 2023-08-27 MED ORDER — OMEPRAZOLE 20 MG PO CPDR: 20.0000 mg | DELAYED_RELEASE_CAPSULE | Freq: Every day | ORAL | 1 refills | Status: DC

## 2023-08-27 NOTE — Patient Instructions (Addendum)
-  It was nice to meet you and look forward to taking care of you. -Placed a referral to GI for chronic constipation. Please call the office or send a MyChart message if you do not receive a phone call or a MyChart message about appointment in 2 weeks.  -Ordered labs (CBC, CMP, Lipid panel, and TSH) based on establishing care with BMI 23-29, and screening. Office will call with results and will be available on MyChart.  -Refilled Omeprazole  for GERD. -Recommend to follow back up with neurology on migraine management.  -Follow up in 1 year for a physical.

## 2023-08-27 NOTE — Progress Notes (Signed)
 New Patient Office Visit  Subjective   Patient ID: Connie Roach, female    DOB: Feb 25, 1980  Age: 43 y.o. MRN: [id number]  CC:  Chief Complaint  Patient presents with   Establish Care    HPI Connie Roach presents to establish care with new provider.  Patients previous primary care provider: None  Specialist: [facility] Dr. Rosaline Cobble  [facility]- Dr. Delon Lenis  [place] Maralee Morita  [facility] [place].  [facility] with Dr. Onetha Epp   GERD: Chronic. Patient has been taking Tagement with hopes it would help with GERD and skin warts. However, it is not helping either one very well. She would like to go back on Omeprazole  that she was previously on and working well.   Patient would like a referral to GI. Concerned she may have IBS. She has a history of constipation. She takes Colene ax a couple times a week with hardly improvement. But, if she takes more than that it become diarrhea. Taking GasX daily.   Outpatient Encounter Medications as of 08/27/2023  Medication Sig   ALPRAZolam (XANAX) 0.5 MG tablet Take 0.5 mg by mouth 3 (three) times daily as needed.   buPROPion (WELLBUTRIN XL) 150 MG 24 hr tablet Take 150 mg by mouth daily.   Cetirizine  HCl 10 MG CAPS Take 1 capsule (10 mg total) by mouth daily.   diphenhydrAMINE (BENADRYL) 25 mg capsule Take 25 mg by mouth every 6 (six) hours as needed.   fluticasone (FLONASE) 50 MCG/ACT nasal spray Instill 2 puffs each nostril every night.   Fluticasone Propionate  (XHANCE ) 93 MCG/ACT EXHU Place 2 sprays into both nostrils 2 (two) times daily. Use Xhance  nasal spray 1-2 sprays each nostril 1-2 times a day for congestion/ear pain contro   Fluticasone Propionate  (XHANCE ) 93 MCG/ACT EXHU Place 2 sprays into the nose in the morning and at bedtime.   ibuprofen (ADVIL) 200 MG tablet Take 400 mg by mouth daily as needed.   Multiple  Vitamin (MULTIVITAMIN PO) Take by mouth daily. OTC medication   omeprazole  (PRILOSEC) 20 MG capsule Take 1 capsule (20 mg total) by mouth daily.   Probiotic Product (PROBIOTIC DAILY PO)    rizatriptan  (MAXALT -MLT) 10 MG disintegrating tablet Take 1 tablet (10 mg total) by mouth as needed for migraine. May repeat in 2 hours if needed   topiramate  (TOPAMAX ) 25 MG tablet Take 1 tablet (25 mg total) by mouth at bedtime. Must keep pending appointment for further refills.   [DISCONTINUED] Triprolidine-Pseudoephedrine (ANTIHISTAMINE PO) Take 1 tablet by mouth as needed. (Patient not taking: Reported on 08/27/2023)   No facility-administered encounter medications on file as of 08/27/2023.    Past Medical History:  Diagnosis Date   ADHD    Allergy  04/2023   Skin testing   Anxiety    Depression    GERD (gastroesophageal reflux disease)    Migraine     Past Surgical History:  Procedure Laterality Date   ESSURE TUBAL LIGATION  2011   TUBAL LIGATION  11/09/2009   Essure (permanent implant)   uterine ablation  2018    Family History  Problem Relation Age of Onset   Headache Mother    Anxiety disorder Mother    Depression Mother    Eczema Father    Allergic rhinitis Father    Alcohol abuse Father    Anxiety disorder Brother    Breast cancer Paternal  Aunt 55   Emphysema Maternal Grandfather    Breast cancer Paternal Grandmother 30   Leukemia Paternal Grandfather    ADD / ADHD Brother    Drug abuse Brother    Early death Brother    Asthma Neg Hx    Urticaria Neg Hx     Social History   Socioeconomic History   Marital status: Divorced    Spouse name: separated, not legally   Number of children: 2   Years of education: Not on file   Highest education level: Master's degree (e.g., MA, MS, MEng, MEd, MSW, MBA)  Occupational History   Not on file  Tobacco Use   Smoking status: Every Day    Current packs/day: 1.50    Types: Cigarettes    Passive exposure: Never   Smokeless tobacco:  Never   Tobacco comments:    2 packs per week  Vaping Use   Vaping status: Never Used  Substance and Sexual Activity   Alcohol use: Yes    Alcohol/week: 2.0 - 4.0 standard drinks of alcohol    Types: 1 - 2 Glasses of wine, 1 - 2 Cans of beer per week    Comment: 2 times a week   Drug use: Not Currently    Frequency: 1.0 times per week    Types: Marijuana   Sexual activity: Yes    Birth control/protection: Other-see comments    Comment: Essure  Other Topics Concern   Not on file  Social History Narrative   Lives at home with her daughters   Right handed   Caffeine: 1-2 cups/day   Social Drivers of Corporate investment banker Strain: Low Risk  (08/25/2023)   Overall Financial Resource Strain (CARDIA)    Difficulty of Paying Living Expenses: Not hard at all  Food Insecurity: No Food Insecurity (08/25/2023)   Hunger Vital Sign    Worried About Running Out of Food in the Last Year: Never true    Ran Out of Food in the Last Year: Never true  Transportation Needs: No Transportation Needs (08/25/2023)   PRAPARE - Administrator, Civil Service (Medical): No    Lack of Transportation (Non-Medical): No  Physical Activity: Insufficiently Active (08/25/2023)   Exercise Vital Sign    Days of Exercise per Week: 3 days    Minutes of Exercise per Session: 30 min  Stress: No Stress Concern Present (08/25/2023)   [employer] of Occupational Health - Occupational Stress Questionnaire    Feeling of Stress: Only a little  Social Connections: Socially Isolated (08/25/2023)   Social Connection and Isolation Panel    Frequency of Communication with Friends and Family: Three times a week    Frequency of Social Gatherings with Friends and Family: Once a week    Attends Religious Services: Never    Database administrator or Organizations: No    Attends Engineer, structural: Not on file    Marital Status: Divorced  Intimate Partner Violence: Not At Risk (08/27/2023)   Humiliation,  Afraid, Rape, and Kick questionnaire    Fear of Current or Ex-Partner: No    Emotionally Abused: No    Physically Abused: No    Sexually Abused: No    ROS See HPI above    Objective  BP 118/80   Pulse 87   Temp 98.4 F (36.9 C) (Oral)   Ht 5' 2 (1.575 m)   Wt 131 lb (59.4 kg)   SpO2 98%  BMI 23.96 kg/m   Physical Exam Vitals reviewed.  Constitutional:      General: She is not in acute distress.    Appearance: Normal appearance. She is not ill-appearing, toxic-appearing or diaphoretic.  HENT:     Head: Normocephalic and atraumatic.  Eyes:     General:        Right eye: No discharge.        Left eye: No discharge.     Conjunctiva/sclera: Conjunctivae normal.  Cardiovascular:     Rate and Rhythm: Normal rate and regular rhythm.     Heart sounds: Normal heart sounds. No murmur heard.    No friction rub. No gallop.  Pulmonary:     Effort: Pulmonary effort is normal. No respiratory distress.     Breath sounds: Normal breath sounds.  Musculoskeletal:        General: Normal range of motion.  Skin:    General: Skin is warm and dry.  Neurological:     General: No focal deficit present.     Mental Status: She is alert and oriented to person, place, and time. Mental status is at baseline.  Psychiatric:        Mood and Affect: Mood normal.        Behavior: Behavior normal.        Thought Content: Thought content normal.        Judgment: Judgment normal.       Assessment & Plan:  Gastroesophageal reflux disease without esophagitis -     Omeprazole ; Take 1 capsule (20 mg total) by mouth daily.  Dispense: 90 capsule; Refill: 1  Chronic constipation -     Ambulatory referral to Gastroenterology  BMI 23.0-23.9, adult -     CBC with Differential/Platelet -     Comprehensive metabolic panel with GFR -     Lipid panel -     TSH  Screening for cardiovascular condition -     Lipid panel  Encounter to establish care -     CBC with Differential/Platelet -      Comprehensive metabolic panel with GFR -     Lipid panel -     TSH   1.Review health maintenance:  -Cervical cancer screening, Unknown, request records from GYN  -Tdap vaccine: Unknown, request records from GYN  -Hep B vaccine: Unknown, request records from GYN  -HIV and Hep C screening: request records from GYN  -PNA vaccine: Not had  -[facility]: [facility]  2. Placed a referral to GI for chronic constipation.  3. Ordered labs (CBC, CMP, Lipid panel, and TSH) based on establishing care with BMI 23-29, and screening. Office will call with results and will be available on MyChart.  4.Refilled Omeprazole  for GERD. 5.Recommend to follow back up with neurology on migraine management. Will take over medication if neurology only wants to see her as PRN and if symptoms were to become worse, she would follow back up with them if taking over medication at later time.  Return in about 1 year (around 08/26/2024) for physical.   Michelina Mexicano, NP

## 2023-09-01 ENCOUNTER — Ambulatory Visit: Payer: Self-pay | Admitting: Family Medicine

## 2023-09-08 ENCOUNTER — Ambulatory Visit: Admitting: Dermatology

## 2023-09-08 ENCOUNTER — Encounter: Payer: Self-pay | Admitting: Dermatology

## 2023-09-08 VITALS — BP 100/66 | HR 79

## 2023-09-08 DIAGNOSIS — B079 Viral wart, unspecified: Secondary | ICD-10-CM

## 2023-09-08 DIAGNOSIS — G8929 Other chronic pain: Secondary | ICD-10-CM | POA: Insufficient documentation

## 2023-09-08 MED ORDER — IMIQUIMOD 3.75 % EX CREA
1.0000 | TOPICAL_CREAM | Freq: Every day | CUTANEOUS | 8 refills | Status: AC
Start: 2023-09-08 — End: ?

## 2023-09-08 NOTE — Patient Instructions (Addendum)
 Plan to start Imiquimod  1-2 weeks after blistering subsides   Cryotherapy Aftercare  Wash gently with soap and water everyday.   Apply Vaseline and Band-Aid daily until healed.    Important Information   Due to recent changes in healthcare laws, you may see results of your pathology and/or laboratory studies on MyChart before the doctors have had a chance to review them. We understand that in some cases there may be results that are confusing or concerning to you. Please understand that not all results are received at the same time and often the doctors may need to interpret multiple results in order to provide you with the best plan of care or course of treatment. Therefore, we ask that you please give us  2 business days to thoroughly review all your results before contacting the office for clarification. Should we see a critical lab result, you will be contacted sooner.     If You Need Anything After Your Visit   If you have any questions or concerns for your doctor, please call our main line at [phone]. If no one answers, please leave a voicemail as directed and we will return your call as soon as possible. Messages left after 4 pm will be answered the following business day.    You may also send us  a message via MyChart. We typically respond to MyChart messages within 1-2 business days.  For prescription refills, please ask your pharmacy to contact our office. Our fax number is [phone].  If you have an urgent issue when the clinic is closed that cannot wait until the next business day, you can page your doctor at the number below.     Please note that while we do our best to be available for urgent issues outside of office hours, we are not available 24/7.    If you have an urgent issue and are unable to reach us , you may choose to seek medical care at your doctor's office, retail clinic, urgent care center, or emergency room.   If you have a medical emergency, please  immediately call 911 or go to the emergency department. In the event of inclement weather, please call our main line at [phone] for an update on the status of any delays or closures.  Dermatology Medication Tips: Please keep the boxes that topical medications come in in order to help keep track of the instructions about where and how to use these. Pharmacies typically print the medication instructions only on the boxes and not directly on the medication tubes.   If your medication is too expensive, please contact our office at [phone] or send us  a message through MyChart.    We are unable to tell what your co-pay for medications will be in advance as this is different depending on your insurance coverage. However, we may be able to find a substitute medication at lower cost or fill out paperwork to get insurance to cover a needed medication.    If a prior authorization is required to get your medication covered by your insurance company, please allow us  1-2 business days to complete this process.   Drug prices often vary depending on where the prescription is filled and some pharmacies may offer cheaper prices.   The website www.goodrx.com contains coupons for medications through different pharmacies. The prices here do not account for what the cost may be with help from insurance (it may be cheaper with your insurance), but the website can give you the price if you  did not use any insurance.  - You can print the associated coupon and take it with your prescription to the pharmacy.  - You may also stop by our office during regular business hours and pick up a GoodRx coupon card.  - If you need your prescription sent electronically to a different pharmacy, notify our office through [facility] or by phone at [phone]

## 2023-09-08 NOTE — Progress Notes (Signed)
   Follow-Up Visit   Subjective  Connie Roach is a 43 y.o. female who presents for the following: Warts  Patient present today for follow up visit for Warts. Patient was last evaluated on 12/17/2022. At this visit patient had cryo therapy completed and was advised to take OTC Cimetidine for maintenance. Patient reports she stopped the cimetidine because it was not helping control her acid reflux. Patient reports sxs are unchanged. Patient denies medication changes.  The following portions of the chart were reviewed this encounter and updated as appropriate: medications, allergies, medical history  Review of Systems:  No other skin or systemic complaints except as noted in HPI or Assessment and Plan.  Objective  Well appearing patient in no apparent distress; mood and affect are within normal limits.  A full examination was performed including scalp, head, eyes, ears, nose, lips, neck, chest, axillae, abdomen, back, buttocks, bilateral upper extremities, bilateral lower extremities, hands, feet, fingers, toes, fingernails, and toenails. All findings within normal limits unless otherwise noted below.   Relevant exam findings are noted in the Assessment and Plan.  Left Ankle - Anterior (10), Left Medial Heel (5), Left Thigh - Anterior (5), Left Tip of Hallux (5), Right Ankle - Anterior (5), Right Thigh - Anterior (5) Flat topped verrucus papules  Assessment & Plan   WART Exam: verrucous papule(s)  Counseling Discussed viral / HPV (Human Papilloma Virus) etiology and risk of spread /infectivity to other areas of body as well as to other people.  Multiple treatments and methods may be required to clear warts and it is possible treatment may not be successful.  Treatment risks include discoloration; scarring and there is still potential for wart recurrence.  Treatment Plan: - Prescribed Imiquimod  Packets to swab with daily - Cryo Therapy completed while in office today - Plan to follow  up in October  VIRAL WARTS, UNSPECIFIED TYPE (35) Left Ankle - Anterior (10), Left Medial Heel (5), Left Thigh - Anterior (5), Left Tip of Hallux (5), Right Ankle - Anterior (5), Right Thigh - Anterior (5) Imiquimod  3.75 % CREA - Left Ankle - Anterior (10), Left Medial Heel (5), Left Thigh - Anterior (5), Left Tip of Hallux (5), Right Ankle - Anterior (5), Right Thigh - Anterior (5) Apply 1 Application topically daily.  Return in about 3 months (around 12/09/2023) for Wart F/U.  I, Jetta Ager, am acting as Neurosurgeon for [employer], DO.  Documentation: I have reviewed the above documentation for accuracy and completeness, and I agree with the above.  Delon Lenis, DO

## 2023-09-15 ENCOUNTER — Other Ambulatory Visit: Payer: Self-pay | Admitting: Obstetrics and Gynecology

## 2023-09-15 HISTORY — PX: HYSTEROTOMY: SHX1776

## 2023-09-17 LAB — SURGICAL PATHOLOGY

## 2023-10-06 ENCOUNTER — Other Ambulatory Visit: Payer: Self-pay

## 2023-10-29 ENCOUNTER — Other Ambulatory Visit: Payer: Self-pay | Admitting: Obstetrics and Gynecology

## 2023-10-29 DIAGNOSIS — Z1231 Encounter for screening mammogram for malignant neoplasm of breast: Secondary | ICD-10-CM

## 2023-12-08 ENCOUNTER — Other Ambulatory Visit: Payer: Self-pay

## 2023-12-09 ENCOUNTER — Ambulatory Visit (INDEPENDENT_AMBULATORY_CARE_PROVIDER_SITE_OTHER): Admitting: Dermatology

## 2023-12-09 ENCOUNTER — Encounter: Payer: Self-pay | Admitting: Dermatology

## 2023-12-09 VITALS — BP 126/83 | HR 80

## 2023-12-09 DIAGNOSIS — B079 Viral wart, unspecified: Secondary | ICD-10-CM | POA: Diagnosis not present

## 2023-12-09 DIAGNOSIS — Z7189 Other specified counseling: Secondary | ICD-10-CM

## 2023-12-09 NOTE — Patient Instructions (Addendum)

## 2023-12-09 NOTE — Progress Notes (Signed)
   Follow-Up Visit   Subjective  Connie Roach is a 43 y.o. female who presents for the following: Warts  Patient present today for follow up visit for Warts. Patient was last evaluated on 09/08/23. At this visit patient was prescribed Imiquimod  Packets to apply daily & had Cryo completed. Patient reports sxs are improving but not at goal. Patient denies medication changes.  The following portions of the chart were reviewed this encounter and updated as appropriate: medications, allergies, medical history  Review of Systems:  No other skin or systemic complaints except as noted in HPI or Assessment and Plan.  Objective  Well appearing patient in no apparent distress; mood and affect are within normal limits.  A full examination was performed including scalp, head, eyes, ears, nose, lips, neck, chest, axillae, abdomen, back, buttocks, bilateral upper extremities, bilateral lower extremities, hands, feet, fingers, toes, fingernails, and toenails. All findings within normal limits unless otherwise noted below.   Relevant exam findings are noted in the Assessment and Plan.    Assessment & Plan   WART Exam: verrucous papule(s)  Counseling Discussed viral / HPV (Human Papilloma Virus) etiology and risk of spread /infectivity to other areas of body as well as to other people.  Multiple treatments and methods may be required to clear warts and it is possible treatment may not be successful.  Treatment risks include discoloration; scarring and there is still potential for wart recurrence.  Treatment Plan: - Cryo Therapy Completed while in office today  - Recommended to restart the Cimetidine daily consistently for 3 months  VIRAL WARTS, UNSPECIFIED TYPE (6) Left Ankle - Anterior, Left Foot - Anterior, Left Lower Leg - Anterior, Right Ankle - Anterior, Right Foot - Anterior, Right Lower Leg - Anterior Destruction of lesion - Left Ankle - Anterior, Left Foot - Anterior, Right Ankle -  Anterior, Right Foot - Anterior Complexity: simple   Destruction method: cryotherapy   Informed consent: discussed and consent obtained   Timeout:  patient name, date of birth, surgical site, and procedure verified Lesion destroyed using liquid nitrogen: Yes   Cryotherapy cycles:  20 Post-procedure details: wound care instructions given    Related Medications Imiquimod  3.75 % CREA Apply 1 Application topically daily.  Return in 8 weeks (on 02/03/2024) for Wart F/U (Ok'd to DB at 0:15 Appt Slot per JD).  I, Jetta Ager, am acting as Neurosurgeon for [employer], DO.  Documentation: I have reviewed the above documentation for accuracy and completeness, and I agree with the above.  Delon Lenis, DO

## 2023-12-10 ENCOUNTER — Ambulatory Visit: Admitting: Dermatology

## 2023-12-12 ENCOUNTER — Ambulatory Visit
Admission: RE | Admit: 2023-12-12 | Discharge: 2023-12-12 | Disposition: A | Discharge status: Home / Self Care | Source: Ambulatory Visit | Attending: Obstetrics and Gynecology | Admitting: Obstetrics and Gynecology

## 2023-12-12 DIAGNOSIS — Z1231 Encounter for screening mammogram for malignant neoplasm of breast: Secondary | ICD-10-CM

## 2023-12-22 ENCOUNTER — Encounter: Payer: Self-pay | Admitting: Radiology

## 2023-12-22 NOTE — Progress Notes (Signed)
 "     Ellouise Console, PA-C [address] [place], [place]  [address] Phone: [phone]   Gastroenterology Consultation  Referring Provider:     Billy Philippe SAUNDERS, NP Primary Care Physician:  Billy Philippe SAUNDERS, NP Primary Gastroenterologist:  Ellouise Console, PA-C / Elspeth Naval, MD  Reason for Consultation:     Chronic constipation        HPI:   Discussed the use of AI scribe software for clinical note transcription with the patient, who gave verbal consent to proceed. History of Present Illness Connie Roach is a 43 year old female, new patient, with chronic constipation who presents for evaluation and management of her symptoms.  She has experienced chronic constipation since infancy, characterized by infrequent bowel movements occurring once or twice a week, with hard stools and straining. Occasionally, she experiences sudden episodes of diarrhea. She has been using Miralax daily for three years and also takes Gas-X frequently.  She experiences abdominal bloating and gas, which she describes as making her appear 'pregnant' at times. The bloating is often accompanied by flatulence and is located more in the middle abdomen. The longer she goes without a bowel movement, the worse the bloating and gas become.  No family history of colon cancer. No blood in stool or unintentional weight loss. Recent blood work from July showed normal hemoglobin levels at 14.4. Her thyroid function tests, including TSH, were normal.  08/27/2023 normal CBC, CMP, TSH.     Past Medical History:  Diagnosis Date   ADHD    Allergy  04/2023   Skin testing   Anxiety    Constipation    Depression    Gas bloat syndrome    GERD (gastroesophageal reflux disease)    Hemorrhoids    IBS (irritable bowel syndrome)    Migraine     Past Surgical History:  Procedure Laterality Date   ESSURE TUBAL LIGATION  2011   HYSTEROTOMY Bilateral 09/15/2023   Ovaries remain   TUBAL LIGATION  11/09/2009   Essure  (permanent implant)   uterine ablation  2018    Prior to Admission medications   Medication Sig Start Date End Date Taking? Authorizing Provider  ALPRAZolam (XANAX) 0.5 MG tablet Take 0.5 mg by mouth 3 (three) times daily as needed.    [provider]  buPROPion (WELLBUTRIN XL) 150 MG 24 hr tablet Take 150 mg by mouth daily. 11/04/20   [provider]  Cetirizine  HCl 10 MG CAPS Take 1 capsule (10 mg total) by mouth daily. 08/13/23   Jeneal Danita Macintosh, MD  diphenhydrAMINE (BENADRYL) 25 mg capsule Take 25 mg by mouth every 6 (six) hours as needed.    [provider]  fluticasone (FLONASE) 50 MCG/ACT nasal spray Instill 2 puffs each nostril every night. 04/23/21   [provider]  Fluticasone Propionate  (XHANCE ) 93 MCG/ACT EXHU Place 2 sprays into both nostrils 2 (two) times daily. Use Xhance  nasal spray 1-2 sprays each nostril 1-2 times a day for congestion/ear pain contro 08/13/23   Jeneal Danita Macintosh, MD  Fluticasone Propionate  (XHANCE ) 93 MCG/ACT EXHU Place 2 sprays into the nose in the morning and at bedtime. 08/13/23   Jeneal Danita Macintosh, MD  hydrOXYzine (ATARAX) 50 MG tablet Take 50 mg by mouth daily as needed.    [provider]  ibuprofen (ADVIL) 200 MG tablet Take 400 mg by mouth daily as needed.    [provider]  Imiquimod  3.75 % CREA Apply 1 Application topically daily.  09/08/23   Alm Delon SAILOR, DO  Multiple Vitamin (MULTIVITAMIN PO) Take by mouth daily. OTC medication    [provider]  omeprazole  (PRILOSEC) 20 MG capsule Take 1 capsule (20 mg total) by mouth daily. 08/27/23 02/23/24  Billy Philippe SAUNDERS, NP  Probiotic Product (PROBIOTIC DAILY PO)  07/20/23   [provider]  rizatriptan  (MAXALT -MLT) 10 MG disintegrating tablet Take 1 tablet (10 mg total) by mouth as needed for migraine. May repeat in 2 hours if needed 12/28/21   Lomax, Amy, NP  topiramate  (TOPAMAX ) 25 MG tablet Take 1 tablet (25 mg  total) by mouth at bedtime. Must keep pending appointment for further refills. 12/28/21   Lomax, Amy, NP    Family History  Problem Relation Age of Onset   Headache Mother    Anxiety disorder Mother    Depression Mother    Eczema Father    Allergic rhinitis Father    Alcohol abuse Father    Anxiety disorder Brother    Breast cancer Paternal Aunt 36   Emphysema Maternal Grandfather    Breast cancer Paternal Grandmother 17   Leukemia Paternal Grandfather    ADD / ADHD Brother    Drug abuse Brother    Early death Brother    Asthma Neg Hx    Urticaria Neg Hx      Social History   Tobacco Use   Smoking status: Every Day    Current packs/day: 1.50    Types: Cigarettes    Passive exposure: Never   Smokeless tobacco: Never   Tobacco comments:    2 packs per week  Vaping Use   Vaping status: Never Used  Substance Use Topics   Alcohol use: Yes    Alcohol/week: 2.0 - 4.0 standard drinks of alcohol    Types: 1 - 2 Glasses of wine, 1 - 2 Cans of beer per week    Comment: 2 times a week   Drug use: Not Currently    Frequency: 1.0 times per week    Types: Marijuana    Allergies as of 12/23/2023 - Review Complete 12/23/2023  Allergen Reaction Noted   Ambrosia artemisiifolia (ragweed) skin test  09/08/2023   Horse epithelium allergy  skin test  09/08/2023   Mixed grasses  09/08/2023   Molds & smuts  09/08/2023   Nickel Other (See Comments) 09/08/2023   Other  09/08/2023   Tobacco  09/08/2023   Tree extract  09/08/2023    Review of Systems:    All systems reviewed and negative except where noted in HPI.   Physical Exam:  BP 100/64   Pulse (!) 108   Ht 5' 2 (1.575 m)   Wt 132 lb 8 oz (60.1 kg)   LMP 11/08/2013   BMI 24.23 kg/m  Patient's last menstrual period was 11/08/2013.  General:   Alert,  Well-developed, well-nourished, pleasant and cooperative in NAD Lungs:  Respirations even and unlabored.  Clear throughout to auscultation.   No wheezes, crackles, or  rhonchi. No acute distress. Heart:  Regular rate and rhythm; no murmurs, clicks, rubs, or gallops. Abdomen:  Normal bowel sounds.  No bruits.  Soft, and non-distended without masses, hepatosplenomegaly or hernias noted.  No Tenderness.  No guarding or rebound tenderness.    Neurologic:  Alert and oriented x3;  grossly normal neurologically. Psych:  Alert and cooperative. Normal mood and affect.   Imaging Studies: MM 3D SCREENING MAMMOGRAM BILATERAL BREAST Result Date: 12/16/2023 CLINICAL DATA:  Screening. EXAM: DIGITAL SCREENING BILATERAL  MAMMOGRAM WITH TOMOSYNTHESIS AND CAD TECHNIQUE: Bilateral screening digital craniocaudal and mediolateral oblique mammograms were obtained. Bilateral screening digital breast tomosynthesis was performed. The images were evaluated with computer-aided detection. COMPARISON:  Previous exam(s). ACR Breast Density Category c: The breasts are heterogeneously dense, which may obscure Halberstam masses. FINDINGS: There are no findings suspicious for malignancy. IMPRESSION: No mammographic evidence of malignancy. A result letter of this screening mammogram will be mailed directly to the patient. RECOMMENDATION: Screening mammogram in one year. (Code:SM-B-01Y) BI-RADS CATEGORY  1: Negative. Electronically Signed   By: Curtistine Noble   On: 12/16/2023 15:00    Labs: CBC    Component Value Date/Time   WBC 5.8 08/27/2023 0839   RBC 4.58 08/27/2023 0839   HGB 14.4 08/27/2023 0839   HCT 43.6 08/27/2023 0839   PLT 250.0 08/27/2023 0839   MCV 95.2 08/27/2023 0839    CMP     Component Value Date/Time   NA 138 08/27/2023 0839   K 3.7 08/27/2023 0839   CL 104 08/27/2023 0839   CO2 28 08/27/2023 0839   GLUCOSE 91 08/27/2023 0839   BUN 14 08/27/2023 0839   CREATININE 0.81 08/27/2023 0839   CALCIUM 9.3 08/27/2023 0839   PROT 7.5 08/27/2023 0839   ALBUMIN 4.7 08/27/2023 0839   AST 17 08/27/2023 0839   ALT 12 08/27/2023 0839   ALKPHOS 41 08/27/2023 0839   BILITOT 0.5  08/27/2023 0839   GFRNONAA >60 01/08/2020 1308    Assessment and Plan:   Connie Roach is a 43 y.o. y/o female has been referred for:  1. Chronic lifelong constipation with bloating and gas likely due to slow colon transit. 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 No alarm symptoms for colonoscopy. - Provided Linzess  72 mcg sample while daily for one week. - If constipation persists, provided Linzess  145 mcg sample daily for one week. - Instructed to report response via MyChart for dosage adjustment. - Consider Amitiza or Trulance if Linzess  ineffective or cost prohibitive. - Advised high fiber diet and 64 ounces of water daily. - Scheduled follow-up in six weeks.  2.  Colon cancer screening - Advised first screening colonoscopy will be due at age 67. - If she develops symptoms such as rectal bleeding, iron deficiency anemia, or unintentional weight loss, then earlier colonoscopy would be recommended.  She has no GI symptoms to warrant colonoscopy at this time.  No family history of Colon Cancer.  Follow up in 6 weeks for constipation.  Ellouise Console, PA-C   "

## 2023-12-23 ENCOUNTER — Encounter: Payer: Self-pay | Admitting: Physician Assistant

## 2023-12-23 ENCOUNTER — Ambulatory Visit (INDEPENDENT_AMBULATORY_CARE_PROVIDER_SITE_OTHER): Admitting: Physician Assistant

## 2023-12-23 VITALS — BP 100/64 | HR 108 | Ht 62.0 in | Wt 132.5 lb

## 2023-12-23 DIAGNOSIS — K581 Irritable bowel syndrome with constipation: Secondary | ICD-10-CM | POA: Diagnosis not present

## 2023-12-23 DIAGNOSIS — K5904 Chronic idiopathic constipation: Secondary | ICD-10-CM | POA: Diagnosis not present

## 2023-12-23 NOTE — Progress Notes (Signed)
 Agree with assessment and plan as outlined.

## 2023-12-23 NOTE — Patient Instructions (Signed)
 Linzess works best when taken once a day every day, on an empty stomach, at least 30 minutes before your first meal of the day.  When Linzess is taken daily as directed:  *Constipation relief is typically felt in about a week *IBS-C patients may begin to experience relief from belly pain and overall abdominal symptoms (pain, discomfort, and bloating) in about 1 week,   with symptoms typically improving over 12 weeks.  Diarrhea may occur in the first 2 weeks -keep taking it.  The diarrhea should go away and you should start having normal, complete, full bowel movements. It may be helpful to start treatment when you can be near the comfort of your own bathroom, such as a weekend.   Please follow up sooner if symptoms increase or worsen  Due to recent changes in healthcare laws, you may see the results of your imaging and laboratory studies on MyChart before your provider has had a chance to review them.  We understand that in some cases there may be results that are confusing or concerning to you. Not all laboratory results come back in the same time frame and the provider may be waiting for multiple results in order to interpret others.  Please give us  48 hours in order for your provider to thoroughly review all the results before contacting the office for clarification of your results.   Thank you for trusting me with your gastrointestinal care!   Ellouise Console, PA-C _______________________________________________________  If your blood pressure at your visit was 140/90 or greater, please contact your primary care physician to follow up on this.  _______________________________________________________  If you are age 42 or older, your body mass index should be between 23-30. Your Body mass index is 24.23 kg/m. If this is out of the aforementioned range listed, please consider follow up with your Primary Care Provider.  If you are age 71 or younger, your body mass index should be between  19-25. Your Body mass index is 24.23 kg/m. If this is out of the aformentioned range listed, please consider follow up with your Primary Care Provider.   ________________________________________________________  The [facility] providers would like to encourage you to use MYCHART to communicate with providers for non-urgent requests or questions.  Due to long hold times on the telephone, sending your provider a message by [facility] may be a faster and more efficient way to get a response.  Please allow 48 business hours for a response.  Please remember that this is for non-urgent requests.  _______________________________________________________

## 2023-12-30 ENCOUNTER — Other Ambulatory Visit: Payer: Self-pay

## 2023-12-30 DIAGNOSIS — K5904 Chronic idiopathic constipation: Secondary | ICD-10-CM

## 2023-12-30 MED ORDER — LINACLOTIDE 72 MCG PO CAPS: 72.0000 ug | ORAL_CAPSULE | Freq: Every day | ORAL | 3 refills | Status: DC

## 2024-01-21 ENCOUNTER — Encounter: Payer: Self-pay | Admitting: Orthopaedic Surgery

## 2024-01-27 ENCOUNTER — Other Ambulatory Visit: Payer: Self-pay

## 2024-01-27 DIAGNOSIS — M545 Low back pain, unspecified: Secondary | ICD-10-CM

## 2024-01-27 NOTE — Telephone Encounter (Signed)
 Please refer to newton/megan.  Thanks.

## 2024-01-29 ENCOUNTER — Other Ambulatory Visit: Payer: Self-pay | Admitting: Neurology

## 2024-01-29 MED ORDER — TOPIRAMATE 25 MG PO TABS: 25.0000 mg | ORAL_TABLET | Freq: Every day | ORAL | 0 refills | Status: AC

## 2024-01-29 NOTE — Telephone Encounter (Signed)
 Pt called stating that she is needing a new Rx for her topiramate  (TOPAMAX ) 25 MG tablet  sent in to the [facility] on [address]  Pt has been scheduled a f/u with a new MD.

## 2024-02-04 ENCOUNTER — Ambulatory Visit: Admitting: Dermatology

## 2024-02-04 ENCOUNTER — Encounter: Payer: Self-pay | Admitting: Dermatology

## 2024-02-04 VITALS — BP 116/77 | HR 77

## 2024-02-04 DIAGNOSIS — B079 Viral wart, unspecified: Secondary | ICD-10-CM | POA: Diagnosis not present

## 2024-02-04 DIAGNOSIS — Z7189 Other specified counseling: Secondary | ICD-10-CM

## 2024-02-04 NOTE — Patient Instructions (Signed)

## 2024-02-04 NOTE — Progress Notes (Unsigned)
 Connie Console, PA-C [address] [place], [place]  [address] Phone: [phone]   Primary Care Physician: Billy Philippe SAUNDERS, NP  Primary Gastroenterologist:  Connie Console, PA-C / Dr. Gordy Starch   Chief Complaint:  F/U Chronic Constipation       HPI:   Discussed the use of AI scribe software for clinical note transcription with the patient, who gave verbal consent to proceed.  43 year old female returns for 6 week f/u Chronic lifelong constipation.  She tried samples of Linzess  72 micrograms and 145 mcg doses once daily.  Initially had diarrhea the first week which resolved.  She likes the lower dose of 72 mcg better.  Needs medication refill.  She has no GI concerns today.  No previous colonoscopy.  No family hx colon cancer.  First screening colonoscopy will be due age 54.  08/2023 labs: Normal CMP, CBC, and TSH.    Current Outpatient Medications  Medication Sig Dispense Refill   ALPRAZolam (XANAX) 0.5 MG tablet Take 0.5 mg by mouth 3 (three) times daily as needed.     buPROPion (WELLBUTRIN XL) 150 MG 24 hr tablet Take 150 mg by mouth daily.     Cetirizine  HCl 10 MG CAPS Take 1 capsule (10 mg total) by mouth daily. 30 capsule 11   diphenhydrAMINE (BENADRYL) 25 mg capsule Take 25 mg by mouth every 6 (six) hours as needed.     Fluticasone Propionate  (XHANCE ) 93 MCG/ACT EXHU Place 2 sprays into both nostrils 2 (two) times daily. Use Xhance  nasal spray 1-2 sprays each nostril 1-2 times a day for congestion/ear pain contro 16 mL 11   hydrOXYzine (ATARAX) 50 MG tablet Take 50 mg by mouth daily as needed.     ibuprofen (ADVIL) 200 MG tablet Take 400 mg by mouth daily as needed.     Imiquimod  3.75 % CREA Apply 1 Application topically daily. 28 g 8   Multiple Vitamin (MULTIVITAMIN PO) Take by mouth daily. OTC medication     omeprazole  (PRILOSEC) 20 MG capsule Take 1 capsule (20 mg total) by mouth daily. 90 capsule 1   Probiotic Product (PROBIOTIC DAILY PO)      rizatriptan   (MAXALT -MLT) 10 MG disintegrating tablet Take 1 tablet (10 mg total) by mouth as needed for migraine. May repeat in 2 hours if needed 9 tablet 11   sertraline (ZOLOFT) 50 MG tablet Take 50 mg by mouth daily.     topiramate  (TOPAMAX ) 25 MG tablet Take 1 tablet (25 mg total) by mouth at bedtime. Must keep pending appointment for further refills. 60 tablet 0   linaclotide  (LINZESS ) 72 MCG capsule Take 1 capsule (72 mcg total) by mouth daily before breakfast. 90 capsule 3   meloxicam  (MOBIC ) 7.5 MG tablet Take 1 tablet (7.5 mg total) by mouth daily. 30 tablet 0   No current facility-administered medications for this visit.    Allergies as of 02/05/2024 - Review Complete 02/05/2024  Allergen Reaction Noted   Ambrosia artemisiifolia (ragweed) skin test  09/08/2023   Horse epithelium allergy  skin test  09/08/2023   Mixed grasses  09/08/2023   Molds & smuts  09/08/2023   Nickel Other (See Comments) 09/08/2023   Other  09/08/2023   Tobacco  09/08/2023   Tree extract  09/08/2023    Past Medical History:  Diagnosis Date   ADHD    Allergy  04/2023   Skin testing   Anxiety    Constipation    Depression    Gas  bloat syndrome    GERD (gastroesophageal reflux disease)    Hemorrhoids    IBS (irritable bowel syndrome)    Migraine     Past Surgical History:  Procedure Laterality Date   ESSURE TUBAL LIGATION  2011   HYSTEROTOMY Bilateral 09/15/2023   Ovaries remain   TUBAL LIGATION  11/09/2009   Essure (permanent implant)   uterine ablation  2018    Review of Systems:    All systems reviewed and negative except where noted in HPI.    Physical Exam:  BP 100/74 (BP Location: Left Arm, Patient Position: Sitting, Cuff Size: Normal)   Pulse 84   Ht 5' 1.5 (1.562 m) Comment: height measured without shoes  Wt 127 lb 8 oz (57.8 kg)   LMP 11/08/2013   BMI 23.70 kg/m  Patient's last menstrual period was 11/08/2013.  General: Well-nourished, well-developed in no acute distress.  Neuro:  Alert and oriented x 3.  Grossly intact.  Psych: Alert and cooperative, normal mood and affect.   Imaging Studies: No results found.  Labs: CBC    Component Value Date/Time   WBC 5.8 08/27/2023 0839   RBC 4.58 08/27/2023 0839   HGB 14.4 08/27/2023 0839   HCT 43.6 08/27/2023 0839   PLT 250.0 08/27/2023 0839   MCV 95.2 08/27/2023 0839   MCH 30.9 01/08/2020 1308   MCHC 33.2 08/27/2023 0839   RDW 13.4 08/27/2023 0839   LYMPHSABS 1.6 08/27/2023 0839   MONOABS 0.4 08/27/2023 0839   EOSABS 0.1 08/27/2023 0839   BASOSABS 0.0 08/27/2023 0839    CMP     Component Value Date/Time   NA 138 08/27/2023 0839   K 3.7 08/27/2023 0839   CL 104 08/27/2023 0839   CO2 28 08/27/2023 0839   GLUCOSE 91 08/27/2023 0839   BUN 14 08/27/2023 0839   CREATININE 0.81 08/27/2023 0839   CALCIUM 9.3 08/27/2023 0839   PROT 7.5 08/27/2023 0839   ALBUMIN 4.7 08/27/2023 0839   AST 17 08/27/2023 0839   ALT 12 08/27/2023 0839   ALKPHOS 41 08/27/2023 0839   BILITOT 0.5 08/27/2023 0839   GFRNONAA >60 01/08/2020 1308       Assessment and Plan:   ALEIAH MOHAMMED is a 43 y.o. y/o female returns for follow-up of  Chronic lifelong constipation: Improved on Linzess  72mg  daily. IBS-C  Plan: - Rx Linzess  72mcg once daily, #90, 3 RF. - Continue 64 ounces of fluids and 30 g of dietary fiber daily. - Follow-up in 1 year to refill medication. - Plan for first screening colonoscopy at age 20.  Sooner if she develops rectal bleeding, anemia, or any other alarm symptoms.   Connie Console, PA-C  Follow up 1 year.

## 2024-02-04 NOTE — Progress Notes (Signed)
° °  Follow-Up Visit   Subjective  Connie Roach is a 43 y.o. female who presents for the following: Wart(s)  Patient present today for follow up visit for Warts. Patient was last evaluated on 10/21/20025. At this visit patient had cryo Therapay Completed and advised to restart Cimetidine.Patient reports she is taking Cimetidine but not on a regular basis.  Patient reports sxs are improving. Patient denies medication changes.  The following portions of the chart were reviewed this encounter and updated as appropriate: medications, allergies, medical history  Review of Systems:  No other skin or systemic complaints except as noted in HPI or Assessment and Plan.  Objective  Well appearing patient in no apparent distress; mood and affect are within normal limits.  A focused examination was performed of the following areas: B/L LE and Right Great Toe  Relevant exam findings are noted in the Assessment and Plan.        Assessment & Plan   WART Exam: verrucous papule(s)  Counseling Discussed viral / HPV (Human Papilloma Virus) etiology and risk of spread /infectivity to other areas of body as well as to other people.  Multiple treatments and methods may be required to clear warts and it is possible treatment may not be successful.  Treatment risks include discoloration; scarring and there is still potential for wart recurrence.  Treatment Plan: - Recommended continuing oral Cimetidine - Cryo Therapy and cantharidin completed while in office today, Advised to wash off in 4 hours - Recommended to exfoliate dead skin  - Follow up PRN VIRAL WARTS, UNSPECIFIED TYPE Right Tip of Hallux - Destruction of lesion - Right Tip of Hallux Complexity: simple   Destruction method: chemical removal   Informed consent: discussed and consent obtained   Timeout:  patient name, date of birth, surgical site, and procedure verified Chemical destruction method: cantharidin   Application time:  2  seconds Procedure instructions: patient instructed to wash and dry area   Post-procedure details: wound care instructions given   Additional details:  Wash with Soap and water in 4 hours  - Destruction of lesion - Right Tip of Hallux Complexity: simple   Destruction method: cryotherapy   Informed consent: discussed and consent obtained   Timeout:  patient name, date of birth, surgical site, and procedure verified Lesion destroyed using liquid nitrogen: Yes   Cryotherapy cycles:  1 Post-procedure details: wound care instructions given    Existing Treatments - Imiquimod  3.75 % CREA - Apply 1 Application topically daily.  Return if symptoms worsen or fail to improve, for Wart F/U.  I, Jetta Ager, am acting as neurosurgeon for [employer], DO.  Documentation: I have reviewed the above documentation for accuracy and completeness, and I agree with the above.  Delon Lenis, DO

## 2024-02-05 ENCOUNTER — Ambulatory Visit (INDEPENDENT_AMBULATORY_CARE_PROVIDER_SITE_OTHER): Admitting: Internal Medicine

## 2024-02-05 ENCOUNTER — Encounter: Payer: Self-pay | Admitting: Internal Medicine

## 2024-02-05 ENCOUNTER — Encounter: Payer: Self-pay | Admitting: Physician Assistant

## 2024-02-05 ENCOUNTER — Ambulatory Visit: Payer: Self-pay

## 2024-02-05 ENCOUNTER — Ambulatory Visit: Admitting: Physician Assistant

## 2024-02-05 VITALS — BP 100/74 | HR 84 | Ht 61.5 in | Wt 127.5 lb

## 2024-02-05 VITALS — BP 130/88 | HR 95 | Temp 98.4°F | Wt 127.3 lb

## 2024-02-05 DIAGNOSIS — K581 Irritable bowel syndrome with constipation: Secondary | ICD-10-CM

## 2024-02-05 DIAGNOSIS — K5904 Chronic idiopathic constipation: Secondary | ICD-10-CM

## 2024-02-05 DIAGNOSIS — R0789 Other chest pain: Secondary | ICD-10-CM

## 2024-02-05 MED ORDER — MELOXICAM 7.5 MG PO TABS: 7.5000 mg | ORAL_TABLET | Freq: Every day | ORAL | 0 refills | Status: AC

## 2024-02-05 MED ORDER — LINACLOTIDE 72 MCG PO CAPS: 72.0000 ug | ORAL_CAPSULE | Freq: Every day | ORAL | 3 refills | Status: AC

## 2024-02-05 NOTE — Progress Notes (Signed)
 Established Patient Office Visit     CC/Reason for Visit: Fall with left-sided thoracic wall pain  HPI: Connie Roach is a 43 y.o. female who is coming in today for the above mentioned reasons.  This happened 4 days ago.  Is having significant pain in the left upper thoracic wall right under her left breast.  She fell on her deck onto the floor.  Is having pain with coughing, sneezing or very deep inspiration.   Past Medical/Surgical History: Past Medical History:  Diagnosis Date   ADHD    Allergy  04/2023   Skin testing   Anxiety    Constipation    Depression    Gas bloat syndrome    GERD (gastroesophageal reflux disease)    Hemorrhoids    IBS (irritable bowel syndrome)    Migraine     Past Surgical History:  Procedure Laterality Date   ESSURE TUBAL LIGATION  2011   HYSTEROTOMY Bilateral 09/15/2023   Ovaries remain   TUBAL LIGATION  11/09/2009   Essure (permanent implant)   uterine ablation  2018    Social History:  reports that she has been smoking cigarettes. She has never been exposed to tobacco smoke. She has never used smokeless tobacco. She reports current alcohol use of about 2.0 - 4.0 standard drinks of alcohol per week. She reports that she does not currently use drugs after having used the following drugs: Marijuana. Frequency: 1.00 time per week.  Allergies: Allergies[1]  Family History:  Family History  Problem Relation Age of Onset   Headache Mother    Anxiety disorder Mother    Depression Mother    Eczema Father    Allergic rhinitis Father    Alcohol abuse Father    Anxiety disorder Brother    Breast cancer Paternal Aunt 58   Emphysema Maternal Grandfather    Breast cancer Paternal Grandmother 57   Leukemia Paternal Grandfather    ADD / ADHD Brother    Drug abuse Brother    Early death Brother    Asthma Neg Hx    Urticaria Neg Hx     Current Medications[2]  Review of Systems:  Negative unless indicated in HPI.   Physical  Exam: Vitals:   02/05/24 0954  BP: 130/88  Pulse: 95  Temp: 98.4 F (36.9 C)  TempSrc: Oral  SpO2: 100%  Weight: 127 lb 4.8 oz (57.7 kg)    Body mass index is 23.66 kg/m.   Physical Exam Pulmonary:     Effort: Pulmonary effort is normal.     Breath sounds: Normal breath sounds. No decreased air movement. No decreased breath sounds.  Chest:        Impression and Plan:  Left-sided chest wall pain -     Meloxicam ; Take 1 tablet (7.5 mg total) by mouth daily.  Dispense: 30 tablet; Refill: 0   - Either muscle bruising or rib fracture.  In any case plan to treat with anti-inflammatories and muscle relaxers.  Lung sounds are normal.  Time spent:22 minutes reviewing chart, interviewing and examining patient and formulating plan of care.     Tully Theophilus Andrews, MD [facility]     [1]  Allergies Allergen Reactions   Ambrosia Artemisiifolia (Ragweed) Skin Test    Horse Epithelium Allergy  Skin Test    Mixed Grasses    Molds & Smuts    Nickel Other (See Comments)   Other    Tobacco    Tree Extract   [  2]  Current Outpatient Medications:    meloxicam  (MOBIC ) 7.5 MG tablet, Take 1 tablet (7.5 mg total) by mouth daily., Disp: 30 tablet, Rfl: 0   ALPRAZolam (XANAX) 0.5 MG tablet, Take 0.5 mg by mouth 3 (three) times daily as needed., Disp: , Rfl:    buPROPion (WELLBUTRIN XL) 150 MG 24 hr tablet, Take 150 mg by mouth daily., Disp: , Rfl:    Cetirizine  HCl 10 MG CAPS, Take 1 capsule (10 mg total) by mouth daily., Disp: 30 capsule, Rfl: 11   diphenhydrAMINE (BENADRYL) 25 mg capsule, Take 25 mg by mouth every 6 (six) hours as needed., Disp: , Rfl:    Fluticasone Propionate  (XHANCE ) 93 MCG/ACT EXHU, Place 2 sprays into both nostrils 2 (two) times daily. Use Xhance  nasal spray 1-2 sprays each nostril 1-2 times a day for congestion/ear pain contro, Disp: 16 mL, Rfl: 11   hydrOXYzine (ATARAX) 50 MG tablet, Take 50 mg by mouth daily as needed., Disp: ,  Rfl:    ibuprofen (ADVIL) 200 MG tablet, Take 400 mg by mouth daily as needed., Disp: , Rfl:    Imiquimod  3.75 % CREA, Apply 1 Application topically daily., Disp: 28 g, Rfl: 8   linaclotide  (LINZESS ) 72 MCG capsule, Take 1 capsule (72 mcg total) by mouth daily before breakfast., Disp: 90 capsule, Rfl: 3   Multiple Vitamin (MULTIVITAMIN PO), Take by mouth daily. OTC medication, Disp: , Rfl:    omeprazole  (PRILOSEC) 20 MG capsule, Take 1 capsule (20 mg total) by mouth daily., Disp: 90 capsule, Rfl: 1   Probiotic Product (PROBIOTIC DAILY PO), , Disp: , Rfl:    rizatriptan  (MAXALT -MLT) 10 MG disintegrating tablet, Take 1 tablet (10 mg total) by mouth as needed for migraine. May repeat in 2 hours if needed, Disp: 9 tablet, Rfl: 11   sertraline (ZOLOFT) 50 MG tablet, Take 50 mg by mouth daily., Disp: , Rfl:    topiramate  (TOPAMAX ) 25 MG tablet, Take 1 tablet (25 mg total) by mouth at bedtime. Must keep pending appointment for further refills., Disp: 60 tablet, Rfl: 0

## 2024-02-05 NOTE — Patient Instructions (Signed)
 Linzess  works best when taken once a day every day, on an empty stomach, at least 30 minutes before your first meal of the day.  When Linzess  is taken daily as directed:  *Constipation relief is typically felt in about a week *IBS-C patients may begin to experience relief from belly pain and overall abdominal symptoms (pain, discomfort, and bloating) in about 1 week,   with symptoms typically improving over 12 weeks.  Diarrhea may occur in the first 2 weeks -keep taking it.  The diarrhea should go away and you should start having normal, complete, full bowel movements. It may be helpful to start treatment when you can be near the comfort of your own bathroom, such as a weekend.   Please follow up sooner if symptoms increase or worsen  Due to recent changes in healthcare laws, you may see the results of your imaging and laboratory studies on MyChart before your provider has had a chance to review them.  We understand that in some cases there may be results that are confusing or concerning to you. Not all laboratory results come back in the same time frame and the provider may be waiting for multiple results in order to interpret others.  Please give us  48 hours in order for your provider to thoroughly review all the results before contacting the office for clarification of your results.   Thank you for trusting me with your gastrointestinal care!   Ellouise Console, PA-C _______________________________________________________  If your blood pressure at your visit was 140/90 or greater, please contact your primary care physician to follow up on this.  _______________________________________________________  If you are age 42 or older, your body mass index should be between 23-30. Your Body mass index is 23.7 kg/m. If this is out of the aforementioned range listed, please consider follow up with your Primary Care Provider.  If you are age 47 or younger, your body mass index should be between  19-25. Your Body mass index is 23.7 kg/m. If this is out of the aformentioned range listed, please consider follow up with your Primary Care Provider.   ________________________________________________________  The [facility] providers would like to encourage you to use MYCHART to communicate with providers for non-urgent requests or questions.  Due to long hold times on the telephone, sending your provider a message by [facility] may be a faster and more efficient way to get a response.  Please allow 48 business hours for a response.  Please remember that this is for non-urgent requests.  _______________________________________________________

## 2024-02-05 NOTE — Telephone Encounter (Signed)
 FYI Only or Action Required?: FYI only for provider: appointment scheduled on today.  Patient was last seen in primary care on 08/27/2023 by Billy Philippe SAUNDERS, NP.  Called Nurse Triage reporting Abdominal Pain.  Symptoms began several days ago.  Interventions attempted: Rest, hydration, or home remedies.  Symptoms are: gradually worsening.  Triage Disposition: See HCP Within 4 Hours (Or PCP Triage)  Patient/caregiver understands and will follow disposition?: Yes, will follow disposition  Copied from CRM #[id number]. Topic: Clinical - Red Word Triage >> Feb 05, 2024  9:23 AM Franky GRADE wrote: Red Word that prompted transfer to Nurse Triage: Patient fell on Saturday night,  and hurt the left side of the abdomen around the rib area. Pain has gotten worse since. Reason for Disposition  [1] MILD-MODERATE pain AND [2] constant AND [3] present > 2 hours  Answer Assessment - Initial Assessment Questions 1. LOCATION: Where does it hurt?      Upper abd under breast, post fall pain 3. ONSET: When did the pain begin? (e.g., minutes, hours or days ago)      2 days ago 4. SUDDEN: Gradual or sudden onset?     sudden 5. PATTERN Does the pain come and go, or is it constant?     constant 6. SEVERITY: How bad is the pain?  (e.g., Scale 1-10; mild, moderate, or severe)     Hurts to breathe 9. RELIEVING/AGGRAVATING FACTORS: What makes it better or worse? (e.g., antacids, bending or twisting motion, bowel movement)     Coughing makes it worse,  10. OTHER SYMPTOMS: Do you have any other symptoms? (e.g., back pain, diarrhea, fever, urination pain, vomiting)   Denies fever, denies coughing up blood 11. PREGNANCY: Is there any chance you are pregnant? When was your last menstrual period?       denies  Protocols used: Abdominal Pain - [facility]

## 2024-02-18 ENCOUNTER — Encounter: Payer: Self-pay | Admitting: Physical Medicine and Rehabilitation

## 2024-02-18 ENCOUNTER — Ambulatory Visit: Admitting: Physical Medicine and Rehabilitation

## 2024-02-18 DIAGNOSIS — M7918 Myalgia, other site: Secondary | ICD-10-CM

## 2024-02-18 DIAGNOSIS — M545 Low back pain, unspecified: Secondary | ICD-10-CM | POA: Diagnosis not present

## 2024-02-18 DIAGNOSIS — G8929 Other chronic pain: Secondary | ICD-10-CM | POA: Diagnosis not present

## 2024-02-18 NOTE — Progress Notes (Unsigned)
 "  Connie Roach - 43 y.o. female MRN [id number]  Date of birth: July 29, 1980  Office Visit Note: Visit Date: 02/18/2024 PCP: Billy Philippe SAUNDERS, NP Referred by: Billy Philippe SAUNDERS, NP  Subjective: Chief Complaint  Patient presents with   Lower Back - Pain   HPI: Connie Roach is a 43 y.o. female who comes in today per the request of Dr. Ozell Cummins for evaluation of chronic, worsening and severe right sided lower back pain. Pain ongoing for 5 plus years. Her pain worsens with laying down to sleep. She is able to stand and be active without significant pain. She describes pain as sore, tight and aching sensation, currently rates as 6 out of 10. Some relief of pain with formal physical therapy/chiropractic treatments, home exercise regimen, rest and use of medications. Recent lumbar MRI imaging shows mild bilateral facet arthropathy at L4-L5, no significant nerve impingement, no high grade spinal canal stenosis. No history of lumbar surgery/injections. Patient denies focal weakness, numbness and tingling. No recent trauma or falls.      Review of Systems  Musculoskeletal:  Positive for back pain and myalgias.  Neurological:  Negative for tingling, sensory change, focal weakness and weakness.  All other systems reviewed and are negative.  Otherwise per HPI.  Assessment & Plan: Visit Diagnoses:    ICD-10-CM   1. Chronic right-sided low back pain without sciatica  M54.50 Ambulatory referral to Physical Therapy   G89.29     2. Myofascial pain syndrome  M79.18 Ambulatory referral to Physical Therapy       Plan: Findings:  Chronic, worsening and severe right sided lower back pain. No radicular symptoms down the legs. Patient continues to have severe pain despite good conservative therapies such as formal physical therapy/chiropractic treatments, home exercise regimen, rest and use of medications. Patients clinical presentation and exam are consistent with more myofascial pain syndrome.  Palpable trigger point noted to right lumbar paraspinal region on exam today. I performed myofascial trigger point injection to right lumbar paraspinal region today, she tolerated without difficulty. I also placed referral for her to re-group with physical therapy, specifically for dry needling treatments. Should her pain persist we would consider looking at facet joint injection, however her pain does not fit with typical facet mediated discomfort. There is mild facet arthritis at the level of L4-L5. She has no questions at this time. No red flag symptoms noted upon exam today.     Meds & Orders: No orders of the defined types were placed in this encounter.   Orders Placed This Encounter  Procedures   Trigger Point Inj   Ambulatory referral to Physical Therapy    Follow-up: Return if symptoms worsen or fail to improve.   Procedures: Trigger Point Inj  Date/Time: 02/18/2024 4:04 PM  Performed by: Seairra Otani E, NP Authorized by: Jerolene Kupfer E, NP   Consent Given by:  Patient Indications:  Pain Total # of Trigger Points:  1 Location: back   Needle Size:  25 G Medications #1:  1 mL lidocaine  1 %; 40 mg triamcinolone acetonide 40 MG/ML Patient tolerance:  Patient tolerated the procedure well with no immediate complications Comments: Myofascial trigger point injection to right lumbar paraspinal region, needling technique utilized.        Clinical History: CLINICAL DATA:  Low back pain for 4 years. Right-sided pain. No previous relevant surgery or acute injury.   EXAM: MRI LUMBAR SPINE WITHOUT CONTRAST   TECHNIQUE: Multiplanar, multisequence MR imaging of the  lumbar spine was performed. No intravenous contrast was administered.   COMPARISON:  Lumbar spine CT 01/31/2023   FINDINGS: Segmentation: Conventional anatomy assumed, with the last open disc space designated L5-S1.Concordant with prior imaging.   Alignment:  Physiologic.   Vertebrae: No worrisome osseous  lesion, acute fracture or pars defect. Mild sacroiliac degenerative changes bilaterally.   Conus medullaris: Extends to the L1 level. The conus and cauda equina appear normal.   Paraspinal and other soft tissues: No significant paraspinal findings.   Disc levels:   Sagittal images demonstrate no significant disc space findings within the visualized lower thoracic spine.   L1-2: Normal interspace.   L2-3: Mild loss of disc height with mild disc bulging eccentric to the right. No significant resulting spinal stenosis or foraminal narrowing.   L3-4: Normal interspace.   L4-5: Preserved disc height with mild disc bulging and mild bilateral facet hypertrophy. No resulting spinal stenosis or foraminal narrowing.   L5-S1: Normal interspace.   IMPRESSION: 1. Mild disc bulging at L2-3 and L4-5 without resulting spinal stenosis or foraminal narrowing. 2. No acute findings or explanation for the patient's symptoms.     Electronically Signed   By: Elsie Perone M.D.   On: 07/22/2023 11:23   She reports that she has been smoking cigarettes. She has never been exposed to tobacco smoke. She has never used smokeless tobacco. No results for input(s): HGBA1C, LABURIC in the last 8760 hours.  Objective:  VS:  HT:    WT:   BMI:     BP:   HR: bpm  TEMP: ( )  RESP:  Physical Exam Vitals and nursing note reviewed.  HENT:     Head: Normocephalic and atraumatic.     Right Ear: External ear normal.     Left Ear: External ear normal.     Nose: Nose normal.     Mouth/Throat:     Mouth: Mucous membranes are moist.  Eyes:     Extraocular Movements: Extraocular movements intact.  Cardiovascular:     Rate and Rhythm: Normal rate.     Pulses: Normal pulses.  Pulmonary:     Effort: Pulmonary effort is normal.  Abdominal:     General: Abdomen is flat. There is no distension.  Musculoskeletal:        General: Tenderness present.     Cervical back: Normal range of motion.      Comments: Patient rises from seated position to standing without difficulty. Good lumbar range of motion. No pain noted with facet loading. 5/5 strength noted with bilateral hip flexion, knee flexion/extension, ankle dorsiflexion/plantarflexion and EHL. No clonus noted bilaterally. No pain upon palpation of greater trochanters. No pain with internal/external rotation of bilateral hips. Sensation intact bilaterally. Myofascial tenderness and palpable trigger point to right lumbar paraspinal region. Negative slump test bilaterally. Ambulates without aid, gait steady.     Skin:    General: Skin is warm and dry.     Capillary Refill: Capillary refill takes less than 2 seconds.  Neurological:     General: No focal deficit present.     Mental Status: She is alert and oriented to person, place, and time.     Ortho Exam  Imaging: No results found.  Past Medical/Family/Surgical/Social History: Medications & Allergies reviewed per EMR, new medications updated. Patient Active Problem List   Diagnosis Date Noted   Chronic low back pain 09/08/2023   Osteoarthritis of spinal facet joint 06/05/2023   Migraine 11/14/2022   Acute left otitis  media 04/24/2022   Eustachian tube dysfunction, bilateral 04/23/2021   Low grade squamous intraepithelial lesion (LGSIL) on cervicovaginal cytologic smear 11/02/2019   Medication overuse headache 12/10/2018   Chronic migraine without aura without status migrainosus, not intractable 12/09/2018   Depressive disorder 11/03/2017   Left ear pain 10/30/2010   Past Medical History:  Diagnosis Date   ADHD    Allergy  04/2023   Skin testing   Anxiety    Constipation    Depression    Gas bloat syndrome    GERD (gastroesophageal reflux disease)    Hemorrhoids    IBS (irritable bowel syndrome)    Migraine    Family History  Problem Relation Age of Onset   Headache Mother    Anxiety disorder Mother    Depression Mother    Eczema Father    Allergic rhinitis  Father    Alcohol abuse Father    Anxiety disorder Brother    Breast cancer Paternal Aunt 60   Emphysema Maternal Grandfather    Breast cancer Paternal Grandmother 32   Leukemia Paternal Grandfather    ADD / ADHD Brother    Drug abuse Brother    Early death Brother    Asthma Neg Hx    Urticaria Neg Hx    Past Surgical History:  Procedure Laterality Date   ESSURE TUBAL LIGATION  2011   HYSTEROTOMY Bilateral 09/15/2023   Ovaries remain   TUBAL LIGATION  11/09/2009   Essure (permanent implant)   uterine ablation  2018   Social History   Occupational History   Not on file  Tobacco Use   Smoking status: Every Day    Current packs/day: 1.50    Types: Cigarettes    Passive exposure: Never   Smokeless tobacco: Never   Tobacco comments:    2 packs per week  Vaping Use   Vaping status: Never Used  Substance and Sexual Activity   Alcohol use: Yes    Alcohol/week: 2.0 - 4.0 standard drinks of alcohol    Types: 1 - 2 Glasses of wine, 1 - 2 Cans of beer per week    Comment: 2 times a week   Drug use: Not Currently    Frequency: 1.0 times per week    Types: Marijuana   Sexual activity: Yes    Birth control/protection: Other-see comments    Comment: Essure    "

## 2024-02-18 NOTE — Progress Notes (Unsigned)
 Pain Scale   Average Pain 2 Patient advising she has chronic lower back pain that radiates to her right side when  she is lying down pain decrease during the day. Patient advising she had MRI done in May 2025--results in chart       +Driver, -BT, -Dye Allergies.

## 2024-02-19 MED ORDER — LIDOCAINE HCL 1 % IJ SOLN
1.0000 mL | INTRAMUSCULAR | Status: AC | PRN
  Administered 2024-02-18: 1 mL

## 2024-02-19 MED ORDER — TRIAMCINOLONE ACETONIDE 40 MG/ML IJ SUSP
40.0000 mg | INTRAMUSCULAR | Status: AC | PRN
  Administered 2024-02-18: 40 mg via INTRAMUSCULAR

## 2024-03-03 ENCOUNTER — Ambulatory Visit: Attending: Physical Medicine and Rehabilitation | Admitting: Physical Therapy

## 2024-03-03 ENCOUNTER — Encounter: Payer: Self-pay | Admitting: Physical Therapy

## 2024-03-03 ENCOUNTER — Other Ambulatory Visit: Payer: Self-pay

## 2024-03-03 DIAGNOSIS — G8929 Other chronic pain: Secondary | ICD-10-CM | POA: Diagnosis not present

## 2024-03-03 DIAGNOSIS — M5459 Other low back pain: Secondary | ICD-10-CM | POA: Diagnosis present

## 2024-03-03 DIAGNOSIS — M545 Low back pain, unspecified: Secondary | ICD-10-CM | POA: Diagnosis not present

## 2024-03-03 DIAGNOSIS — M7918 Myalgia, other site: Secondary | ICD-10-CM | POA: Diagnosis not present

## 2024-03-03 NOTE — Therapy (Signed)
 " OUTPATIENT PHYSICAL THERAPY THORACOLUMBAR EVALUATION   Patient Name: Connie Roach MRN: [id number] DOB:Apr 18, 1980, 44 y.o., female Today's Date: 03/03/2024  END OF SESSION:  PT End of Session - 03/03/24 1533     Visit Number 1    Number of Visits 6    Date for Recertification  04/14/24    Progress Note Due on Visit 6    PT Start Time 1533    PT Stop Time 1610    PT Time Calculation (min) 37 min    Activity Tolerance Patient tolerated treatment well    Behavior During Therapy [facility] for tasks assessed/performed          Past Medical History:  Diagnosis Date   ADHD    Allergy  04/2023   Skin testing   Anxiety    Constipation    Depression    Gas bloat syndrome    GERD (gastroesophageal reflux disease)    Hemorrhoids    IBS (irritable bowel syndrome)    Migraine    Past Surgical History:  Procedure Laterality Date   ESSURE TUBAL LIGATION  2011   HYSTEROTOMY Bilateral 09/15/2023   Ovaries remain   TUBAL LIGATION  11/09/2009   Essure (permanent implant)   uterine ablation  2018   Patient Active Problem List   Diagnosis Date Noted   Chronic low back pain 09/08/2023   Osteoarthritis of spinal facet joint 06/05/2023   Migraine 11/14/2022   Acute left otitis media 04/24/2022   Eustachian tube dysfunction, bilateral 04/23/2021   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 Referral for dry needling.  Rationale for Evaluation and Treatment:  Rehabilitation  THERAPY DIAG:  Other low back pain  ONSET DATE: 5 years  SUBJECTIVE:                                                                                                                                                                                           SUBJECTIVE STATEMENT: Pt states that she has had low back pain x5 years. Pt followed up Jan 2021 with MD, short term relief noted. Has been using heat and stretching to manage symptoms. Pt completed bout of PT last year  which helped temporarily. Had injection 02/18/24 with good effect to date. Hysterectomy in July 2025 to help with low back pain, no effect on symptoms. Over the last 6 months, pt reports symptoms ranged from 2/10-7/10. Since the injection symptoms range in intensity from 0/10-3/10. Reports sleeping is the most difficult part of her symptoms. Pt endorses trouble falling asleep, and symptoms will wake her up at night. Able to fall asleep better with MHP, OTC analgesics. Has not been having these problems since the injection. Symptoms are right sided in nature in the lumbar spine and does not change location, denies paraesthesia.   PERTINENT HISTORY:  From referring provider visit 02/18/24:  Chronic, worsening and severe right sided lower back pain. No radicular symptoms down the legs. Patient continues to have severe pain despite good conservative therapies such as formal physical therapy/chiropractic treatments, home exercise regimen, rest and use of medications. Patients clinical presentation and exam are consistent with more myofascial pain syndrome. Palpable trigger point noted to right lumbar paraspinal region on exam today. I performed myofascial trigger point injection to right lumbar paraspinal region today, she tolerated without difficulty. I also placed referral for her to re-group with physical therapy, specifically for dry needling treatments. Should her pain persist we would consider looking at facet joint  injection, however her pain does not fit with typical facet mediated discomfort. There is mild facet arthritis at the level of L4-L5. She has no questions at this time. No red flag symptoms noted upon exam today.   Recent lumbar MRI imaging shows mild bilateral facet arthropathy at L4-L5, no significant nerve impingement, no high grade spinal canal stenosis.  PAIN:  Are you having pain? Yes: NPRS scale: 0/10 Pain location: right lumbar spine Pain description: ache Aggravating factors: lumbar extension, laying down for sleep Relieving factors: heat, medication  PRECAUTIONS: None  RED FLAGS: None   WEIGHT BEARING RESTRICTIONS: No  FALLS:  Has patient fallen in last 6 months? Yes. 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   PLOF: Independent  PATIENT GOALS: maintain reduction in pain, improve activity tolerance, improve sleep/night pain  NEXT MD VISIT: 03/15/24-Neuro  OBJECTIVE:  Note: Objective measures were completed at Evaluation unless otherwise noted.  DIAGNOSTIC FINDINGS:  IMPRESSION: 1. Mild disc bulging at L2-3 and L4-5 without resulting spinal stenosis or foraminal narrowing. 2. No acute findings or explanation for the patient's symptoms.  PATIENT SURVEYS:  Modified Oswestry:  MODIFIED OSWESTRY DISABILITY SCALE  Date: 03/03/24 Score  Pain intensity 0 = I can tolerate the pain I have without having to use pain medication.  2. Personal care (washing, dressing, etc.) 0 =  I can take care of myself normally without causing increased pain.  3. Lifting 2 = Pain prevents me from lifting heavy weights off the floor,but I can manage if the weights are conveniently positioned (e.g. on a table)  4. Walking 0 = Pain does not prevent me from walking any distance  5. Sitting 2 =  Pain prevents me from sitting more than 1 hour.  6. Standing  0 =  I can stand as long as I want without increased pain.  7. Sleeping 2 =  Even when I take pain medication, I sleep less than 6 hours  8. Social Life 0 = My social life is normal and does not increase my pain.  9.  Traveling 1 =  I can travel anywhere, but it increases my pain.  10. Employment/ Homemaking 1 = My normal homemaking/job activities increase my pain, but I can still perform all that is required of me  Total 8/50   Interpretation of scores: Score Category Description  0-20% Minimal Disability The patient can cope with most living activities. Usually no treatment is indicated apart from advice on lifting, sitting and exercise  21-40% Moderate Disability The patient experiences more pain and difficulty with sitting, lifting and standing. Travel and social life are more difficult and they may be disabled from work. Personal care, sexual activity and sleeping are not grossly affected, and the patient can usually be managed by conservative means  41-60% Severe Disability Pain remains the main problem in this group, but activities of daily living are affected. These patients require a detailed investigation  61-80% Crippled Back pain impinges on all aspects of the patients life. Positive intervention is required  81-100% Bed-bound These patients are either bed-bound or exaggerating their symptoms  Bluford FORBES Zoe DELENA Karon DELENA, et al. Surgery versus conservative management of stable thoracolumbar fracture: the PRESTO feasibility RCT. [place] ([place]): [employer]; 2021 Nov. [facility] Technology Assessment, No. 25.62.) Appendix 3, Oswestry Disability Index category descriptors. Available from: Findjewelers.cz  Minimally Clinically Important Difference (MCID) = 12.8%  COGNITION: Overall cognitive status: Within functional limits for tasks assessed     SENSATION: WFL   POSTURE: rounded shoulders and forward head  PALPATION: Appropriate resting  tension of paraspinals in short sit, minor TTP on the right side of the lumbar spine just medial iliac crest  LUMBAR ROM:   AROM eval  Flexion Nil loss, no symptoms   Extension 25% loss, reproduces pain in R lumbar spine  Right lateral flexion Nil loss  Left lateral flexion 25% loss, inc stretch on R lateral trunk  Right rotation   Left rotation    (Blank rows = not tested)  LOWER EXTREMITY ROM:   WNL BLE   Active  Right eval Left eval  Hip flexion    Hip extension    Hip abduction    Hip adduction    Hip internal rotation    Hip external rotation    Knee flexion    Knee extension    Ankle dorsiflexion    Ankle plantarflexion    Ankle inversion    Ankle eversion     (Blank rows = not tested)  LOWER EXTREMITY MMT:  WNL BLE 5/5  MMT Right eval Left eval  Hip flexion    Hip extension    Hip abduction    Hip adduction    Hip internal rotation    Hip external rotation    Knee flexion    Knee extension    Ankle dorsiflexion    Ankle plantarflexion    Ankle inversion    Ankle eversion     (Blank rows = not tested)   TREATMENT DATE:   03/03/24- EVAL  PATIENT EDUCATION:  Education details: Pt educated on relevant anatomy, physiology, pathology, diagnosis, prognosis, progression of care, pain and activity modification related to low back pain Person educated: Patient Education method: Explanation, Demonstration, and Handouts Education comprehension: verbalized understanding and returned demonstration  HOME EXERCISE PROGRAM: Access Code: [email or web address] URL: [email or web address] Date: 03/03/2024 Prepared by: Stann Ohara  Exercises - Standing Lumbar Extension  - 1 sets - 10 reps - 2 hold  ASSESSMENT:  CLINICAL IMPRESSION: Patient is a 44 y.o. F who was seen today for physical therapy evaluation and treatment for  chronic low back pain and myofascial pain. Pt has been doing very well since TPI from referring provider 02/18/24. Pt is apprehensive about moving in a way that will bring back her symptoms. Pt has noted improvement in left side bending ROM following repeated extension in standing x10. Pt provided this intervention for implementation should symptoms inc in her low back before next session. Will plan to see back in 2 weeks for follow up. Pt to gradually ramp up desired activities and monitor symptom response. Will plan to progress HEP, core strength and stability, endurance at next session. Will plan to continue restoration of function and return to PLOF in subsequent sessions at providers discretion. Provisional directional preference of lumbar extension noted. Pt stands to benefit from continued skilled physical therapy to address deficit areas and restore safety with activities and participations at home and in the community.    OBJECTIVE IMPAIRMENTS: decreased activity tolerance, decreased endurance, decreased ROM, increased fascial restrictions, increased muscle spasms, and pain.   ACTIVITY LIMITATIONS: lifting, bending, and sleeping  PARTICIPATION LIMITATIONS: community activity and occupation  PERSONAL FACTORS: Past/current experiences, Profession, and Time since onset of injury/illness/exacerbation are also affecting patient's functional outcome.   REHAB POTENTIAL: Excellent  CLINICAL DECISION MAKING: Stable/uncomplicated  EVALUATION COMPLEXITY: Low   GOALS: Goals reviewed with patient? Yes  SHORT TERM GOALS: Target date: 03/21/24   Pt will report compliance with HEP to work towards ind and home management strategies Baseline: Goal status: INITIAL   2.  Pt will score no greater than 3/50 on ODI to demonstrate improved activity tolerance/ Baseline:  Goal status: INITIAL   3.  Pt will improve lumbar ROM to full and painless in order to demonstrate progress towards activity tolerance  and improved function Baseline:  Goal status: INITIAL      LONG TERM GOALS: Target date: 04/14/24   Pt will score no greater than 0/50 on ODI to demonstrate improved activity tolerance Baseline:  Goal status: INITIAL   2.  Pt will report no greater than 0/10 pain over 7 consecutive days to demonstrate maintained reduction in symptoms and improved tolerance to activity Baseline:  Goal status: INITIAL   3.  Pt will be ind in the management of their symptoms at home and in the community Baseline:  Goal status: INITIAL      PLAN:  PT FREQUENCY: initially plan for every other week pending maintained reduction of symptoms, pt able to be seen 1-2 times per week if symptoms return   PT DURATION: 6 weeks  PLANNED INTERVENTIONS: 97110-Therapeutic exercises, 97530- Therapeutic activity, [id number]- Neuromuscular re-education, 97535- Self Care, [id number]- Manual therapy, [id number]- Electrical stimulation (unattended), 97016- Vasopneumatic device, 20560 (1-2 muscles), 20561 (3+ muscles)- Dry Needling, Patient/Family education, Cryotherapy, and Moist heat.  PLAN FOR NEXT SESSION: core strength and stability, endurance, progress functional movement patterns consistent with desired activities   Stann DELENA Ohara, PT 03/03/2024, 4:41 PM  "

## 2024-03-06 ENCOUNTER — Other Ambulatory Visit: Payer: Self-pay | Admitting: Family Medicine

## 2024-03-06 DIAGNOSIS — K219 Gastro-esophageal reflux disease without esophagitis: Secondary | ICD-10-CM

## 2024-03-15 ENCOUNTER — Ambulatory Visit: Admitting: Neurology

## 2024-03-17 ENCOUNTER — Ambulatory Visit

## 2024-03-31 ENCOUNTER — Ambulatory Visit

## 2024-04-01 ENCOUNTER — Ambulatory Visit: Admitting: Neurology

## 2024-08-12 ENCOUNTER — Ambulatory Visit: Admitting: Allergy
